# Patient Record
Sex: Female | Born: 1945
Health system: Southern US, Community
[De-identification: ages and names within clinical notes are randomized; demographics above are authoritative.]

## PROBLEM LIST (undated history)

## (undated) DIAGNOSIS — R55 Syncope and collapse: Secondary | ICD-10-CM

## (undated) DIAGNOSIS — N289 Disorder of kidney and ureter, unspecified: Secondary | ICD-10-CM

## (undated) DIAGNOSIS — Z8249 Family history of ischemic heart disease and other diseases of the circulatory system: Secondary | ICD-10-CM

## (undated) DIAGNOSIS — B009 Herpesviral infection, unspecified: Secondary | ICD-10-CM

## (undated) DIAGNOSIS — I10 Essential (primary) hypertension: Secondary | ICD-10-CM

## (undated) DIAGNOSIS — M1990 Unspecified osteoarthritis, unspecified site: Secondary | ICD-10-CM

## (undated) DIAGNOSIS — E559 Vitamin D deficiency, unspecified: Secondary | ICD-10-CM

## (undated) DIAGNOSIS — R7303 Prediabetes: Secondary | ICD-10-CM

## (undated) DIAGNOSIS — E785 Hyperlipidemia, unspecified: Secondary | ICD-10-CM

## (undated) HISTORY — DX: Unspecified osteoarthritis, unspecified site: M19.90

## (undated) HISTORY — DX: Essential (primary) hypertension: I10

## (undated) HISTORY — DX: Herpesviral infection, unspecified: B00.9

## (undated) HISTORY — PX: OTHER SURGICAL HISTORY: SHX169

## (undated) HISTORY — DX: Vitamin D deficiency, unspecified: E55.9

## (undated) HISTORY — DX: Family history of ischemic heart disease and other diseases of the circulatory system: Z82.49

## (undated) HISTORY — DX: Hyperlipidemia, unspecified: E78.5

## (undated) HISTORY — DX: Prediabetes: R73.03

## (undated) HISTORY — DX: Disorder of kidney and ureter, unspecified: N28.9

## (undated) HISTORY — DX: Syncope and collapse: R55

## (undated) HISTORY — PX: ABDOMINAL HYSTERECTOMY: SHX81

## (undated) HISTORY — PX: APPENDECTOMY: SHX54

## (undated) HISTORY — PX: BREAST SURGERY: SHX581

---

## 1992-02-02 HISTORY — PX: BREAST CYST ASPIRATION: SHX578

## 2004-03-21 ENCOUNTER — Encounter: Payer: Self-pay | Admitting: Family Medicine

## 2004-03-27 ENCOUNTER — Encounter: Payer: Self-pay | Admitting: Family Medicine

## 2005-09-06 ENCOUNTER — Encounter: Payer: Self-pay | Admitting: Family Medicine

## 2005-09-06 LAB — CONVERTED CEMR LAB
Cholesterol: 193 mg/dL
Glucose, Bld: 95 mg/dL
HDL: 60 mg/dL
LDL Cholesterol: 105 mg/dL
Pap Smear: NORMAL
Triglycerides: 144 mg/dL

## 2007-04-28 ENCOUNTER — Ambulatory Visit: Payer: Self-pay | Admitting: Family Medicine

## 2007-04-28 DIAGNOSIS — M546 Pain in thoracic spine: Secondary | ICD-10-CM | POA: Insufficient documentation

## 2007-04-28 DIAGNOSIS — E785 Hyperlipidemia, unspecified: Secondary | ICD-10-CM | POA: Insufficient documentation

## 2007-04-28 DIAGNOSIS — I10 Essential (primary) hypertension: Secondary | ICD-10-CM | POA: Insufficient documentation

## 2007-05-01 ENCOUNTER — Ambulatory Visit: Payer: Self-pay | Admitting: Family Medicine

## 2007-05-02 LAB — CONVERTED CEMR LAB
ALT: 18 units/L (ref 0–35)
AST: 18 units/L (ref 0–37)
Albumin: 4 g/dL (ref 3.5–5.2)
Alkaline Phosphatase: 54 units/L (ref 39–117)
BUN: 19 mg/dL (ref 6–23)
Bilirubin, Direct: 0.1 mg/dL (ref 0.0–0.3)
CO2: 30 meq/L (ref 19–32)
Calcium: 9.9 mg/dL (ref 8.4–10.5)
Chloride: 105 meq/L (ref 96–112)
Cholesterol: 256 mg/dL (ref 0–200)
Creatinine, Ser: 1 mg/dL (ref 0.4–1.2)
Direct LDL: 193 mg/dL
GFR calc Af Amer: 73 mL/min
GFR calc non Af Amer: 60 mL/min
Glucose, Bld: 111 mg/dL — ABNORMAL HIGH (ref 70–99)
HDL: 54.3 mg/dL (ref >39.0)
Potassium: 3.6 meq/L (ref 3.5–5.1)
Sodium: 141 meq/L (ref 135–145)
Total Bilirubin: 0.5 mg/dL (ref 0.3–1.2)
Total CHOL/HDL Ratio: 4.7
Total Protein: 7.3 g/dL (ref 6.0–8.3)
Triglycerides: 115 mg/dL (ref 0–149)
VLDL: 23 mg/dL (ref 0–40)

## 2007-06-14 ENCOUNTER — Ambulatory Visit: Payer: Self-pay | Admitting: Family Medicine

## 2007-06-14 ENCOUNTER — Telehealth: Payer: Self-pay | Admitting: Family Medicine

## 2007-06-14 DIAGNOSIS — R0789 Other chest pain: Secondary | ICD-10-CM | POA: Insufficient documentation

## 2007-06-14 DIAGNOSIS — M549 Dorsalgia, unspecified: Secondary | ICD-10-CM | POA: Insufficient documentation

## 2007-06-28 ENCOUNTER — Encounter: Payer: Self-pay | Admitting: Family Medicine

## 2007-08-24 ENCOUNTER — Telehealth: Payer: Self-pay | Admitting: Family Medicine

## 2007-08-24 DIAGNOSIS — M8580 Other specified disorders of bone density and structure, unspecified site: Secondary | ICD-10-CM | POA: Insufficient documentation

## 2007-08-30 ENCOUNTER — Encounter (INDEPENDENT_AMBULATORY_CARE_PROVIDER_SITE_OTHER): Payer: Self-pay | Admitting: *Deleted

## 2007-09-08 ENCOUNTER — Ambulatory Visit: Payer: Self-pay | Admitting: Family Medicine

## 2007-09-11 ENCOUNTER — Encounter: Payer: Self-pay | Admitting: Family Medicine

## 2007-09-11 ENCOUNTER — Telehealth: Payer: Self-pay | Admitting: Family Medicine

## 2007-09-11 LAB — CONVERTED CEMR LAB
ALT: 32 U/L
AST: 28 U/L
Albumin: 4.2 g/dL
Alkaline Phosphatase: 63 U/L
BUN: 20 mg/dL
Bilirubin, Direct: 0.1 mg/dL
CO2: 30 meq/L
Calcium: 9.9 mg/dL
Chloride: 105 meq/L
Cholesterol: 239 mg/dL
Creatinine, Ser: 1 mg/dL
Direct LDL: 157.5 mg/dL
GFR calc Af Amer: 73 mL/min
GFR calc non Af Amer: 60 mL/min
Glucose, Bld: 112 mg/dL — ABNORMAL HIGH
HDL: 62.2 mg/dL
Potassium: 4.6 meq/L
Sodium: 139 meq/L
Total Bilirubin: 0.8 mg/dL
Total CHOL/HDL Ratio: 3.8
Total Protein: 7.5 g/dL
Triglycerides: 121 mg/dL
VLDL: 24 mg/dL

## 2007-10-30 ENCOUNTER — Encounter (INDEPENDENT_AMBULATORY_CARE_PROVIDER_SITE_OTHER): Payer: Self-pay | Admitting: *Deleted

## 2007-12-04 ENCOUNTER — Telehealth: Payer: Self-pay | Admitting: Family Medicine

## 2007-12-12 ENCOUNTER — Ambulatory Visit: Payer: Self-pay | Admitting: Family Medicine

## 2007-12-15 LAB — CONVERTED CEMR LAB
ALT: 22 units/L (ref 0–35)
AST: 26 units/L (ref 0–37)
Albumin: 4.3 g/dL (ref 3.5–5.2)
Alkaline Phosphatase: 59 units/L (ref 39–117)
BUN: 18 mg/dL (ref 6–23)
Bilirubin, Direct: 0.1 mg/dL (ref 0.0–0.3)
CO2: 30 meq/L (ref 19–32)
Calcium: 10.5 mg/dL (ref 8.4–10.5)
Chloride: 104 meq/L (ref 96–112)
Cholesterol: 190 mg/dL (ref 0–200)
Creatinine, Ser: 0.9 mg/dL (ref 0.4–1.2)
GFR calc Af Amer: 82 mL/min
GFR calc non Af Amer: 68 mL/min
Glucose, Bld: 104 mg/dL — ABNORMAL HIGH (ref 70–99)
HDL: 60.1 mg/dL (ref >39.0)
LDL Cholesterol: 113 mg/dL — ABNORMAL HIGH (ref 0–99)
Potassium: 5.4 meq/L — ABNORMAL HIGH (ref 3.5–5.1)
Sodium: 141 meq/L (ref 135–145)
Total Bilirubin: 0.7 mg/dL (ref 0.3–1.2)
Total CHOL/HDL Ratio: 3.2
Total Protein: 7.7 g/dL (ref 6.0–8.3)
Triglycerides: 85 mg/dL (ref 0–149)
VLDL: 17 mg/dL (ref 0–40)

## 2007-12-25 ENCOUNTER — Ambulatory Visit: Payer: Self-pay | Admitting: Family Medicine

## 2007-12-25 DIAGNOSIS — J069 Acute upper respiratory infection, unspecified: Secondary | ICD-10-CM | POA: Insufficient documentation

## 2008-04-01 ENCOUNTER — Encounter: Payer: Self-pay | Admitting: Family Medicine

## 2008-04-03 ENCOUNTER — Encounter (INDEPENDENT_AMBULATORY_CARE_PROVIDER_SITE_OTHER): Payer: Self-pay | Admitting: *Deleted

## 2008-06-14 ENCOUNTER — Encounter (INDEPENDENT_AMBULATORY_CARE_PROVIDER_SITE_OTHER): Payer: Self-pay | Admitting: *Deleted

## 2008-11-13 ENCOUNTER — Ambulatory Visit: Payer: Self-pay | Admitting: Family Medicine

## 2008-11-13 ENCOUNTER — Telehealth: Payer: Self-pay | Admitting: Family Medicine

## 2008-11-13 DIAGNOSIS — E7800 Pure hypercholesterolemia, unspecified: Secondary | ICD-10-CM | POA: Insufficient documentation

## 2008-11-15 LAB — CONVERTED CEMR LAB
ALT: 18 units/L (ref 0–35)
AST: 20 units/L (ref 0–37)
Albumin: 3.9 g/dL (ref 3.5–5.2)
Alkaline Phosphatase: 51 units/L (ref 39–117)
BUN: 30 mg/dL — ABNORMAL HIGH (ref 6–23)
CO2: 27 meq/L (ref 19–32)
Calcium: 9.4 mg/dL (ref 8.4–10.5)
Chloride: 104 meq/L (ref 96–112)
Cholesterol: 205 mg/dL — ABNORMAL HIGH (ref 0–200)
Creatinine, Ser: 1 mg/dL (ref 0.4–1.2)
Direct LDL: 134.4 mg/dL
GFR calc non Af Amer: 59.77 mL/min (ref >60)
Glucose, Bld: 105 mg/dL — ABNORMAL HIGH (ref 70–99)
HDL: 54.7 mg/dL (ref >39.00)
Potassium: 4 meq/L (ref 3.5–5.1)
Sodium: 142 meq/L (ref 135–145)
Total Bilirubin: 0.8 mg/dL (ref 0.3–1.2)
Total CHOL/HDL Ratio: 4
Total Protein: 6.8 g/dL (ref 6.0–8.3)
Triglycerides: 117 mg/dL (ref 0.0–149.0)
VLDL: 23.4 mg/dL (ref 0.0–40.0)

## 2008-12-25 ENCOUNTER — Telehealth: Payer: Self-pay | Admitting: Family Medicine

## 2009-01-02 ENCOUNTER — Ambulatory Visit: Payer: Self-pay | Admitting: Family Medicine

## 2009-01-02 DIAGNOSIS — N952 Postmenopausal atrophic vaginitis: Secondary | ICD-10-CM | POA: Insufficient documentation

## 2009-01-02 LAB — CONVERTED CEMR LAB

## 2009-01-06 ENCOUNTER — Telehealth: Payer: Self-pay | Admitting: Family Medicine

## 2010-03-03 NOTE — Letter (Signed)
Summary: Results Follow up Letter  [place] at [facility]  [address] [place], [place] [address]   Phone: [phone]  Fax: [phone]    04/03/2008 MRN: [id number]    [facility] [address] [place], [place]  [address]    Dear Ms. Carrillo,  The following are the results of your recent test(s):  Test         Result    Pap Smear:        Normal _____  Not Normal _____ Comments: ______________________________________________________ Cholesterol: LDL(Bad cholesterol):         Your goal is less than:         HDL (Good cholesterol):       Your goal is more than: Comments:  ______________________________________________________ Mammogram:        Normal __X___  Not Normal _____ Comments:  Yearly follow up is recommended.   ___________________________________________________________________ Hemoccult:        Normal _____  Not normal _______ Comments:    _____________________________________________________________________ Other Tests:    We routinely do not discuss normal results over the telephone.  If you desire a copy of the results, or you have any questions about this information we can discuss them at your next office visit.   Sincerely,     Excell Seltzer, M.D.  AEB:lsf

## 2010-03-03 NOTE — Letter (Signed)
Summary: [place] No Show Letter  [place] at [facility]  [address] [place], [place] [address]   Phone: [phone]  Fax: [phone]    06/14/2008 MRN: [id number]  [facility] [address] [place], [place]  [address]   Dear Patricia Curtis,   Our records indicate that you missed your scheduled appointment with ____lab_________________ on _5.13.10__________.  Please contact this office to reschedule your appointment as soon as possible.  It is important that you keep your scheduled appointments with your physician, so we can provide you the best care possible.  Please be advised that there may be a charge for "no show" appointments.    Sincerely,   [place] at [facility]

## 2010-03-03 NOTE — Letter (Signed)
Summary: [facility] Records  [facility] Records   Imported By: Beau Fanny 08/24/2007 13:34:20  _____________________________________________________________________  External Attachment:    Type:   Image     Comment:   External Document

## 2010-03-03 NOTE — Progress Notes (Signed)
  Phone Note Call from Patient   Caller: Patient Summary of Call: Spoke to the patient. She had a Bone Density August 2009 at [facility] in [place]. She is going to call them today and have those results faxed to our office. Initial call taken by: Carlton Adam,  January 06, 2009 11:01 AM

## 2010-03-03 NOTE — Progress Notes (Signed)
  Phone Note Call from Patient   Caller: Patient Details for Reason: Pt wants to cancel the stress test Summary of Call: Pt has a $3000.00 deductible at this time. She cant afford to have the stress cardiolite at this time . Pt has set up her nerve conduction but cant do the stress right now.  Initial call taken by: Carlton Adam,  Jun 14, 2007 4:42 PM  Follow-up for Phone Call        okay, I doubt this is cardiac pain. Call if other symptoms. Follow-up by: Kerby Nora MD,  Jun 14, 2007 4:57 PM  Additional Follow-up for Phone Call Additional follow up Details #1::        Patient Advised.  Additional Follow-up by: Delilah Shan,  Jun 14, 2007 5:14 PM

## 2010-03-03 NOTE — Assessment & Plan Note (Signed)
Summary: NEW PT TO EST   Vital Signs:  Patient Profile:   65 Years Old Female Height:     64.5 inches Weight:      161.25 pounds Temp:     98.4 degrees F oral Pulse rate:   80 / minute Pulse rhythm:   regular BP sitting:   132 / 90  (left arm) Cuff size:   regular  Vitals Entered By: Delilah Shan (April 28, 2007 10:18 AM)                 Chief Complaint:  New Patient to Establish.  History of Present Illness: Last chol and DM screen 8/07 normal Cholesterol well controlled on simvastatin but out for 2 weeks.    HTN, well controlled when on B meds, out x 2 weeks.  Acute Visit History: Pertinent previous surgeries include appendectomy.          Current Allergies (reviewed today): No known allergies   Past Medical History:    Hyperlipidemia    Hypertension  Past Surgical History:    2006 stress test:negative    2006 Holter Monitor: negative    Appendectomy    Hysterectomy, partial vaginal 1979    Family History:    father: prostate cancer    mother: CHF, CAD, CABG  age 79    brother: CVA age 55, MI age 61    brother: age 80 CABG    brother: stents placed age 77    Family History of CAD Female 1st degree relative <50  Social History:    Occupation: Conservator, museum/gallery    Married    Never Smoked    Alcohol use-no    Regular exercise-no    Diet: low fat, weight watchers, fruits and veggies   Risk Factors:  Tobacco use:  never Alcohol use:  no Exercise:  no  Colonoscopy History:     Date of Last Colonoscopy:  12/13/2005    Results:  Normal   Mammogram History:     Date of Last Mammogram:  09/14/2005    Results:  Normal Bilateral   PAP Smear History:     Date of Last PAP Smear:  09/06/2005    Results:  Normal    Review of Systems  General      hot flashes  CV      Denies chest pain or discomfort.  Resp      Denies shortness of breath.  GI      Denies abdominal pain and bloody stools.  GU      Denies dysuria.   Physical  Exam  General:     Well-developed,well-nourished,in no acute distress; alert,appropriate and cooperative throughout examination Eyes:     No corneal or conjunctival inflammation noted. EOMI. Perrla. Funduscopic exam benign, without hemorrhages, exudates or papilledema. Vision grossly normal. Ears:     External ear exam shows no significant lesions or deformities.  Otoscopic examination reveals clear canals, tympanic membranes are intact bilaterally without bulging, retraction, inflammation or discharge. Hearing is grossly normal bilaterally. Nose:     External nasal examination shows no deformity or inflammation. Nasal mucosa are pink and moist without lesions or exudates. Mouth:     Oral mucosa and oropharynx without lesions or exudates.  Teeth in good repair. Neck:     no carotid bruit or thyromegaly  Lungs:     Normal respiratory effort, chest expands symmetrically. Lungs are clear to auscultation, no crackles or wheezes. Heart:     Normal rate and  regular rhythm. S1 and S2 normal without gallop, murmur, click, rub or other extra sounds.    Impression & Recommendations:  Problem # 1:  HYPERTENSION (ICD-401.9) poor control off medications. Refilled.  Follow at home. Her updated medication list for this problem includes:    Triamterene-hctz 37.5-25 Mg Caps (Triamterene-hctz) .Marland Kitchen... Take 1 tablet by mouth once a day   Problem # 2:  HYPERLIPIDEMIA (ICD-272.4) Due for chol check, she is off meds, but will likely need to restart. Check LFTs on statin. Her updated medication list for this problem includes:    Simvastatin 40 Mg Tabs (Simvastatin) .Marland Kitchen... Take 1 tablet by mouth once a day   Problem # 3:  Preventive Health Care (ICD-V70.0) Due for GYN exam, breast exam, mammogram.  UTD with vaccines.  Encouraged exercise, weight loss, healthy eating habits.   Complete Medication List: 1)  Simvastatin 40 Mg Tabs (Simvastatin) .... Take 1 tablet by mouth once a day 2)  Triamterene-hctz  37.5-25 Mg Caps (Triamterene-hctz) .... Take 1 tablet by mouth once a day 3)  Etodolac 500 Mg Tabs (Etodolac) .... Take 1 tablet by mouth two times a day 4)  Cymbalta 60 Mg Cpep (Duloxetine hcl) .... Take 1 tablet by mouth once a day 5)  Vitamin C 500 Mg Tabs (Ascorbic acid) .... Take 1 tablet by mouth once a day 6)  Vitamin D 400 Unit Tabs (Cholecalciferol) .... Take 1 tablet by mouth once a day 7)  Multivitamins Tabs (Multiple vitamin) .... Take 1 tablet by mouth once a day 8)  Aspirin 81 Mg Tabs (Aspirin) .... Take 1 tablet by mouth once a day 9)  Calcium Carbonate-vitamin D 600-400 Mg-unit Tabs (Calcium carbonate-vitamin d) .... Take 1 tablet by mouth once a day   Patient Instructions: 1)  Return fo fasting lipids and CMET Dx 272.0 2)  black cohosh  for hot flashes    Prescriptions: CYMBALTA 60 MG  CPEP (DULOXETINE HCL) Take 1 tablet by mouth once a day  #30 x 6   Entered and Authorized by:   Kerby Nora MD   Signed by:   Kerby Nora MD on 04/28/2007   Method used:   Electronically sent to ...       [facility] [phone]*       [address] [place], [place]  [address]       Ph: [phone] or [phone]       Fax: [phone]   RxID:   [id number] TRIAMTERENE-HCTZ 37.5-25 MG  CAPS (TRIAMTERENE-HCTZ) Take 1 tablet by mouth once a day  #30 x 6   Entered and Authorized by:   Kerby Nora MD   Signed by:   Kerby Nora MD on 04/28/2007   Method used:   Electronically sent to ...       [facility] [phone]*       [address] [place], [place]  [address]       Ph: [phone] or [phone]       Fax: [phone]   RxID:   [id number] SIMVASTATIN 40 MG  TABS (SIMVASTATIN) Take 1 tablet by mouth once a day  #30 x 6   Entered and Authorized by:   Kerby Nora MD   Signed by:   Kerby Nora MD on 04/28/2007   Method used:  Electronically sent to ...       [facility] [phone]*       [address]        [place], [place]  [address]       Ph: [phone] or [phone]       Fax: [phone]   RxID:   [id number]  ] Current Allergies (reviewed today): No known allergies  Current Medications (including changes made in today's visit):  SIMVASTATIN 40 MG  TABS (SIMVASTATIN) Take 1 tablet by mouth once a day TRIAMTERENE-HCTZ 37.5-25 MG  CAPS (TRIAMTERENE-HCTZ) Take 1 tablet by mouth once a day ETODOLAC 500 MG  TABS (ETODOLAC) Take 1 tablet by mouth two times a day CYMBALTA 60 MG  CPEP (DULOXETINE HCL) Take 1 tablet by mouth once a day VITAMIN C 500 MG  TABS (ASCORBIC ACID) Take 1 tablet by mouth once a day VITAMIN D 400 UNIT  TABS (CHOLECALCIFEROL) Take 1 tablet by mouth once a day MULTIVITAMINS   TABS (MULTIPLE VITAMIN) Take 1 tablet by mouth once a day ASPIRIN 81 MG  TABS (ASPIRIN) Take 1 tablet by mouth once a day CALCIUM CARBONATE-VITAMIN D 600-400 MG-UNIT  TABS (CALCIUM CARBONATE-VITAMIN D) Take 1 tablet by mouth once a day

## 2010-03-03 NOTE — Letter (Signed)
Summary: Results Follow up Letter  [place] at [facility]  [address] [place], [place] [address]   Phone: [phone]  Fax: [phone]    10/30/2007 MRN: [id number]  [facility] [address] [place], [place]  [address]  Dear Ms. Goetzinger,  The following are the results of your recent test(s):  Test         Result    Pap Smear:        Normal _____  Not Normal _____ Comments: ______________________________________________________ Cholesterol: LDL(Bad cholesterol):         Your goal is less than:         HDL (Good cholesterol):       Your goal is more than: Comments:  ______________________________________________________ Mammogram:        Normal _____  Not Normal _____ Comments:  ___________________________________________________________________ Hemoccult:        Normal _____  Not normal _______ Comments:    _____________________________________________________________________ Other Tests:   Bone density:  Stable bone density test.  Please continue your Calcium with Vitamin D and exercise.  Repeat test in 2 years.  (2011).    We routinely do not discuss normal results over the telephone.  If you desire a copy of the results, or you have any questions about this information we can discuss them at your next office visit.   Sincerely,    Excell Seltzer, M.D.  AEB:lsf

## 2010-03-03 NOTE — Progress Notes (Signed)
Summary: refill request for cymbalta  Phone Note Refill Request Message from:  Pharmacy  Refills Requested: Medication #1:  CYMBALTA 30 MG CPEP Take 1 tablet by mouth once a day [facility] [place], phone [phone].  Initial call taken by: Lowella Petties CMA,  December 25, 2008 1:19 PM  Follow-up for Phone Call        Schedule f/u with Dr. Ermalene Searing  Has not been seen in 1 year  Will refill this time only  ok to call in Follow-up by: Hannah Beat MD,  December 25, 2008 2:14 PM  Additional Follow-up for Phone Call Additional follow up Details #1::        rx called in Additional Follow-up by: Benny Lennert CMA (AAMA),  December 25, 2008 3:08 PM    Prescriptions: CYMBALTA 30 MG CPEP (DULOXETINE HCL) Take 1 tablet by mouth once a day  #30 x 0   Entered and Authorized by:   Hannah Beat MD   Signed by:   Hannah Beat MD on 12/25/2008   Method used:   Telephoned to ...         RxID:   [id number]

## 2010-03-03 NOTE — Progress Notes (Signed)
Summary: Old Records: [facility]  Phone Note Other Incoming   Details for Reason: Old Records: [facility] Summary of Call: Notify pt old records reviewed. She had osteopenia on her last bone density test in 2007. She is on calcium. We check DXA every 2 years, so she is due. Let me know if we can send referral.  Initial call taken by: Kerby Nora MD,  August 24, 2007 12:09 PM  Follow-up for Phone Call        Left message on voicemail to return call. Delilah Shan  August 24, 2007 12:22 PM   Left message on voicemail to return call. Delilah Shan  August 28, 2007 10:47 AM   Patient Advised. Please send order to [facility].  Patient says she had her last one in [facility] and is willing to go back there if need be or [facility] is fine with her.  She does need an early a.m. appt. wherever. Follow-up by: Delilah Shan,  August 28, 2007 2:28 PM  New Problems: OSTEOPENIA (ICD-733.90)   New Problems: OSTEOPENIA (ICD-733.90)       Preventive Care Screening  Bone Density:    Date:  09/01/2005    Next Due:  09/2007    Results:  abnormal, osteopenia     osteopenia

## 2010-03-03 NOTE — Progress Notes (Signed)
Summary: needs cymbalta refilled  Phone Note Refill Request Call back at [phone] Message from:  Patient  Refills Requested: Medication #1:  CYMBALTA 30 MG CPEP Take 1 tablet by mouth once a day gradually wean down Please send to [facility] pharmacy, phone [phone].  Initial call taken by: Lowella Petties CMA,  November 13, 2008 10:11 AM  Follow-up for Phone Call        call in to pharm Follow-up by: Hannah Beat MD,  November 13, 2008 10:31 AM  Additional Follow-up for Phone Call Additional follow up Details #1::        rx called in Additional Follow-up by: Benny Lennert CMA (AAMA),  November 13, 2008 10:34 AM    New/Updated Medications: CYMBALTA 30 MG CPEP (DULOXETINE HCL) Take 1 tablet by mouth once a day Prescriptions: CYMBALTA 30 MG CPEP (DULOXETINE HCL) Take 1 tablet by mouth once a day  #30 x 5   Entered and Authorized by:   Hannah Beat MD   Signed by:   Benny Lennert CMA (AAMA) on 11/13/2008   Method used:   Telephoned to ...         RxID:   [id number]

## 2010-03-03 NOTE — Progress Notes (Signed)
Summary: lost script for cymbalta  Phone Note Call from Patient Message from:  Patient  Caller: Patient Call For: Kerby Nora MD Summary of Call: Pt states she was given script for cymbalta 30 mgs at last office visit, she has lost this, asks if [facility] [address]- she has some 60 mg's but doesnt want to go back to that, she is trying to come off of this. Initial call taken by: Lowella Petties,  December 04, 2007 9:58 AM    New/Updated Medications: CYMBALTA 30 MG CPEP (DULOXETINE HCL) Take 1 tablet by mouth once a day gradually wean down   Prescriptions: CYMBALTA 30 MG CPEP (DULOXETINE HCL) Take 1 tablet by mouth once a day gradually wean down  #30 x 3   Entered and Authorized by:   Kerby Nora MD   Signed by:   Kerby Nora MD on 12/04/2007   Method used:   [facility]        [facility] [phone]* (retail)       [address] [place], [place]  [address]       Ph: [phone] or [phone]       Fax: [phone]   RxID:   [id number]

## 2010-03-03 NOTE — Letter (Signed)
Summary: [place] No Show Letter  [place] at [facility]  [address] [place], [place] [address]   Phone: [phone]  Fax: [phone]    08/30/2007 MRN: [id number]  [facility] [place] [facility] [address] [place], [place]  [address]   Dear Ms. Voshell,   Our records indicate that you missed your scheduled appointment with __lab_______________ on _7-29-09__________.  Please contact this office to reschedule your appointment as soon as possible.  It is important that you keep your scheduled appointments with your physician, so we can provide you the best care possible.  Please be advised that there may be a charge for "no show" appointments.    Sincerely,   [place] at [facility]

## 2010-03-03 NOTE — Assessment & Plan Note (Signed)
Summary: cpx/rbh   Vital Signs:  Patient profile:   65 year old female Height:      64.5 inches Weight:      165.6 pounds BMI:     28.09 Temp:     98.1 degrees F oral Pulse rate:   76 / minute Pulse rhythm:   regular BP sitting:   120 / 78  (left arm) Cuff size:   regular  Vitals Entered By: Benny Lennert CMA Duncan Dull) (January 02, 2009 2:05 PM)  History of Present Illness: Chief complaint cpx with pap  The patient is here for annual wellness exam and preventative care.    She is moving to [place] with her husband for his new job.   She had partial hysterectomy years ago..? if cervix remains.   Cervical DDD. controlled with Cymbalta.   Has noted bruise on low back oand right inner.  Has been taking baby aspirin and tylenol.   Problems Prior to Update: 1)  Routine Gynecological Examination  (ICD-V72.31) 2)  Physical Examination  (ICD-V70.0) 3)  Vaginitis, Atrophic  (ICD-627.3) 4)  Pure Hypercholesterolemia  (ICD-272.0) 5)  Uri  (ICD-465.9) 6)  Osteopenia  (ICD-733.90) 7)  Back Pain, Upper  (ICD-724.5) 8)  Chest Pain, Atypical  (ICD-786.59) 9)  Family History of Cad Female 1st Degree Relative <50  (ICD-V17.3) 10)  Back Pain, Thoracic Region, Chronic  (ICD-724.1) 11)  Hypertension  (ICD-401.9) 12)  Hyperlipidemia  (ICD-272.4)  Current Medications (verified): 1)  Simvastatin 40 Mg  Tabs (Simvastatin) .... Take 1 Tablet By Mouth Once A Day 2)  Triamterene-Hctz 37.5-25 Mg  Caps (Triamterene-Hctz) .... Take 1 Tablet By Mouth Once A Day 3)  Cymbalta 30 Mg Cpep (Duloxetine Hcl) .... Take 1 Tablet By Mouth Once A Day 4)  Vitamin C 500 Mg  Tabs (Ascorbic Acid) .... Take 1 Tablet By Mouth Once A Day 5)  Vitamin D 400 Unit  Tabs (Cholecalciferol) .... Take 1 Tablet By Mouth Once A Day 6)  Multivitamins   Tabs (Multiple Vitamin) .... Take 1 Tablet By Mouth Once A Day 7)  Aspirin 81 Mg  Tabs (Aspirin) .... Take 1 Tablet By Mouth Once A Day 8)  Calcium Carbonate-Vitamin D 600-400  Mg-Unit  Tabs (Calcium Carbonate-Vitamin D) .... Take 1 Tablet By Mouth Once A Day  Allergies (verified): No Known Drug Allergies  Past History:  Past medical, surgical, family and social histories (including risk factors) reviewed, and no changes noted (except as noted below).  Past Medical History: Reviewed history from 04/28/2007 and no changes required. Hyperlipidemia Hypertension  Past Surgical History: Reviewed history from 04/28/2007 and no changes required. 2006 stress test:negative 2006 Holter Monitor: negative Appendectomy Hysterectomy, partial vaginal 1979  Family History: Reviewed history from 04/28/2007 and no changes required. father: prostate cancer mother: CHF, CAD, CABG  age 64 brother: CVA age 76, MI age 37 brother: age 71 CABG brother: stents placed age 45 Family History of CAD Female 1st degree relative <50  Social History: Reviewed history from 04/28/2007 and no changes required. Occupation: Conservator, museum/gallery Married Never Smoked Alcohol use-no Regular exercise-no Diet: low fat, weight watchers, fruits and veggies  Review of Systems General:  Denies fatigue and fever. CV:  Denies chest pain or discomfort. Resp:  Denies shortness of breath. GI:  Complains of constipation; denies abdominal pain, bloody stools, and diarrhea. GU:  Denies abnormal vaginal bleeding, dysuria, hematuria, nocturia, urinary frequency, and urinary hesitancy; vaginal dryness, vaginal itching no odor. .  Physical Exam  General:  Well-developed,well-nourished,in no acute distress; alert,appropriate and cooperative throughout examination Eyes:  No corneal or conjunctival inflammation noted. EOMI. Perrla. Funduscopic exam benign, without hemorrhages, exudates or papilledema. Vision grossly normal. Ears:  External ear exam shows no significant lesions or deformities.  Otoscopic examination reveals clear canals, tympanic membranes are intact bilaterally without bulging, retraction,  inflammation or discharge. Hearing is grossly normal bilaterally. Nose:  External nasal examination shows no deformity or inflammation. Nasal mucosa are pink and moist without lesions or exudates. Mouth:  Oral mucosa and oropharynx without lesions or exudates.  Teeth in good repair. Neck:  no carotid bruit or thyromegaly no cervical or supraclavicular lymphadenopathy  Lungs:  Normal respiratory effort, chest expands symmetrically. Lungs are clear to auscultation, no crackles or wheezes. Heart:  Normal rate and regular rhythm. S1 and S2 normal without gallop, murmur, click, rub or other extra sounds. Abdomen:  Bowel sounds positive,abdomen soft and non-tender without masses, organomegaly or hernias noted. Genitalia:  Pelvic Exam:        External: vagainal atrophy and episiotomy scarring        Vagina: atrophy and abraisions        Cervix: none        Adnexa: normal bimanual exam without masses or fullness        Uterus: none        Pap smear: not performed Skin:  linear bruise right inner thigh.Patricia Kitchenappears from object, bruising mild low back Psych:  Cognition and judgment appear intact. Alert and cooperative with normal attention span and concentration. No apparent delusions, illusions, hallucinations   Impression & Recommendations:  Problem # 1:  Preventive Health Care (ICD-V70.0) Reviewed preventive care protocols, scheduled due services, and updated immunizations. Encouraged exercise, weight loss, healthy eating habits.   Problem # 2:  Gynecological examination-routine (ICD-V72.31) No cervix.Patricia KitchenDVE only...nml ovary exam.   Problem # 3:  VAGINITIS, ATROPHIC (ICD-627.3) Discussed risks and benefits of premarin vaginal cream.  Treat with taper down to smallest effective dose. Follow up with GYN if symptoms not resolving.  Her updated medication list for this problem includes:    Premarin 0.625 Mg/gm Crea (Estrogens, conjugated) .Patricia Curtis... 1 g pv daily x 1-2 weeks then space gradually to 1-3 times a  week  Complete Medication List: 1)  Simvastatin 40 Mg Tabs (Simvastatin) .... Take 1 tablet by mouth once a day 2)  Triamterene-hctz 37.5-25 Mg Caps (Triamterene-hctz) .... Take 1 tablet by mouth once a day 3)  Cymbalta 30 Mg Cpep (Duloxetine hcl) .... Take 1 tablet by mouth once a day 4)  Vitamin C 500 Mg Tabs (Ascorbic acid) .... Take 1 tablet by mouth once a day 5)  Vitamin D 400 Unit Tabs (Cholecalciferol) .... Take 1 tablet by mouth once a day 6)  Multivitamins Tabs (Multiple vitamin) .... Take 1 tablet by mouth once a day 7)  Aspirin 81 Mg Tabs (Aspirin) .... Take 1 tablet by mouth once a day 8)  Calcium Carbonate-vitamin D 600-400 Mg-unit Tabs (Calcium carbonate-vitamin d) .... Take 1 tablet by mouth once a day 9)  Premarin 0.625 Mg/gm Crea (Estrogens, conjugated) .Patricia Curtis.. 1 g pv daily x 1-2 weeks then space gradually to 1-3 times a week  Other Orders: Radiology Referral (Radiology) Tdap => 40yrs IM (25956) Admin 1st Vaccine (38756) Admin 1st Vaccine [facility]) [phone])  Patient Instructions: 1)  Referral Appointment Information 2)  Day/Date: 3)  Time: 4)  Place/MD: 5)  Address: 6)  Phone/Fax: 7)  Patient given appointment information. Information/Orders faxed/mailed.  8)  Start premarin cream ..follow up if not improving.  Prescriptions: PREMARIN 0.625 MG/GM CREA (ESTROGENS, CONJUGATED) 1 g PV daily x 1-2 weeks then space gradually to 1-3 times a week  #1 x 3   Entered and Authorized by:   Kerby Nora MD   Signed by:   Kerby Nora MD on 01/02/2009   Method used:   Electronically to        [facility] [phone]* (retail)       [address] [place], [place]  [address]       Ph: [phone] or [phone]       Fax: [phone]   RxID:   [id number]   Current Allergies (reviewed today): No known allergies   Flu Vaccine Result Date:  12/02/2008 Flu Vaccine Result:  given Flu Vaccine Next Due:  1 yr Flex Sig Next Due:  Not Indicated Hemoccult  Next Due:  Not Indicated Last PAP:  Normal (09/06/2005 10:59:37 AM) PAP Result Date:  01/02/2009 PAP Result:  DVE PAP Next Due:  1 yr    Past Medical History:    Reviewed history from 04/28/2007 and no changes required:       Hyperlipidemia       Hypertension  Past Surgical History:    Reviewed history from 04/28/2007 and no changes required:       2006 stress test:negative       2006 Holter Monitor: negative       Appendectomy       Hysterectomy, partial vaginal 1979         Tetanus/Td Vaccine    Vaccine Type: Tdap    Site: left deltoid    Mfr: GlaxoSmithKline    Dose: 0.5 ml    Given by: Benny Lennert CMA (AAMA)    Exp. Date: 08/02/2010    Lot #: [id number]

## 2015-06-27 LAB — HM COLONOSCOPY

## 2017-02-07 ENCOUNTER — Ambulatory Visit: Payer: BLUE CROSS/BLUE SHIELD | Attending: Internal Medicine | Admitting: Physical Therapy

## 2017-02-07 ENCOUNTER — Encounter: Payer: Self-pay | Admitting: Physical Therapy

## 2017-02-07 ENCOUNTER — Other Ambulatory Visit: Payer: Self-pay

## 2017-02-07 DIAGNOSIS — M546 Pain in thoracic spine: Secondary | ICD-10-CM | POA: Insufficient documentation

## 2017-02-07 DIAGNOSIS — M6281 Muscle weakness (generalized): Secondary | ICD-10-CM | POA: Diagnosis present

## 2017-02-07 DIAGNOSIS — R293 Abnormal posture: Secondary | ICD-10-CM | POA: Diagnosis present
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
## 2017-02-10 ENCOUNTER — Other Ambulatory Visit: Payer: Self-pay

## 2017-02-10 ENCOUNTER — Encounter: Payer: Self-pay | Admitting: Physical Therapy

## 2017-02-10 ENCOUNTER — Ambulatory Visit: Payer: BLUE CROSS/BLUE SHIELD | Admitting: Physical Therapy

## 2017-02-10 DIAGNOSIS — M546 Pain in thoracic spine: Secondary | ICD-10-CM

## 2017-02-10 DIAGNOSIS — R293 Abnormal posture: Secondary | ICD-10-CM

## 2017-02-10 DIAGNOSIS — M6281 Muscle weakness (generalized): Secondary | ICD-10-CM
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
## 2017-02-15 ENCOUNTER — Encounter: Payer: Self-pay | Admitting: Physical Therapy

## 2017-02-15 ENCOUNTER — Ambulatory Visit: Payer: BLUE CROSS/BLUE SHIELD | Admitting: Physical Therapy

## 2017-02-15 DIAGNOSIS — M546 Pain in thoracic spine: Secondary | ICD-10-CM | POA: Diagnosis not present

## 2017-02-15 DIAGNOSIS — R293 Abnormal posture: Secondary | ICD-10-CM

## 2017-02-15 DIAGNOSIS — M6281 Muscle weakness (generalized): Secondary | ICD-10-CM
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
## 2017-02-17 ENCOUNTER — Ambulatory Visit: Payer: BLUE CROSS/BLUE SHIELD

## 2017-02-22 ENCOUNTER — Ambulatory Visit: Payer: BLUE CROSS/BLUE SHIELD

## 2017-02-23 ENCOUNTER — Encounter: Payer: Self-pay | Admitting: Physical Therapy

## 2017-02-23 ENCOUNTER — Other Ambulatory Visit: Payer: Self-pay

## 2017-02-23 ENCOUNTER — Ambulatory Visit: Payer: BLUE CROSS/BLUE SHIELD | Admitting: Physical Therapy

## 2017-02-23 DIAGNOSIS — M546 Pain in thoracic spine: Secondary | ICD-10-CM | POA: Diagnosis not present

## 2017-02-23 DIAGNOSIS — M6281 Muscle weakness (generalized): Secondary | ICD-10-CM

## 2017-02-23 DIAGNOSIS — R293 Abnormal posture: Secondary | ICD-10-CM
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
## 2017-03-01 ENCOUNTER — Ambulatory Visit: Payer: BLUE CROSS/BLUE SHIELD

## 2017-03-02 ENCOUNTER — Ambulatory Visit: Payer: BLUE CROSS/BLUE SHIELD

## 2017-10-17 NOTE — Progress Notes (Deleted)
Cardiology Office Note  Date:  10/17/2017   ID:  Patricia Curtis, DOB [birth year], MRN [id number]  PCP:  No primary care provider on file.   No chief complaint on file.   HPI:   Back pain Atypical chest pain HTN Hyperlipidemia    2006 stress test:negative 2006 Holter Monitor: negative Appendectomy Hysterectomy, partial vaginal 1979  father: prostate cancer mother: CHF, CAD, CABG  age 70 brother: CVA age 5, MI age 94 brother: age 40 CABG brother: stents placed age 42 Family History of CAD Female 1st degree relative <50   PMH:   has no past medical history on file.  PSH:   No past surgical history on file.  No current outpatient medications on file.   No current facility-administered medications for this visit.      Allergies:   Patient has no allergy information on record.   Social History:  The patient     Family History:   family history is not on file.    Review of Systems: ROS   PHYSICAL EXAM: VS:  There were no vitals taken for this visit. , BMI There is no height or weight on file to calculate BMI. GEN: Well nourished, well developed, in no acute distress HEENT: normal Neck: no JVD, carotid bruits, or masses Cardiac: RRR; no murmurs, rubs, or gallops,no edema  Respiratory:  clear to auscultation bilaterally, normal work of breathing GI: soft, nontender, nondistended, + BS MS: no deformity or atrophy Skin: warm and dry, no rash Neuro:  Strength and sensation are intact Psych: euthymic mood, full affect    Recent Labs: No results found for requested labs within last 8760 hours.    Lipid Panel Lab Results  Component Value Date   CHOL 205 (H) 11/13/2008   HDL 54.70 11/13/2008   LDLCALC 113 (H) 12/12/2007   TRIG 117.0 11/13/2008      Wt Readings from Last 3 Encounters:  No data found for Wt       ASSESSMENT AND PLAN:  No diagnosis found.   Disposition:   F/U  6 months  No orders of the defined types were placed in this  encounter.    Signed, Esmond Plants, M.D., Ph.D. 10/17/2017  [place], [place]

## 2017-10-20 ENCOUNTER — Ambulatory Visit: Payer: BLUE CROSS/BLUE SHIELD | Admitting: Cardiovascular Disease

## 2017-11-03 ENCOUNTER — Encounter: Payer: Self-pay | Admitting: Internal Medicine

## 2017-11-03 ENCOUNTER — Ambulatory Visit (INDEPENDENT_AMBULATORY_CARE_PROVIDER_SITE_OTHER): Payer: Medicare Other | Admitting: Internal Medicine

## 2017-11-03 VITALS — BP 132/76 | HR 69 | Temp 98.5°F | Ht 64.5 in | Wt 149.0 lb

## 2017-11-03 DIAGNOSIS — M549 Dorsalgia, unspecified: Secondary | ICD-10-CM | POA: Diagnosis not present

## 2017-11-03 DIAGNOSIS — E119 Type 2 diabetes mellitus without complications: Secondary | ICD-10-CM | POA: Insufficient documentation

## 2017-11-03 DIAGNOSIS — N183 Chronic kidney disease, stage 3 unspecified: Secondary | ICD-10-CM

## 2017-11-03 DIAGNOSIS — M546 Pain in thoracic spine: Secondary | ICD-10-CM

## 2017-11-03 DIAGNOSIS — Z13818 Encounter for screening for other digestive system disorders: Secondary | ICD-10-CM

## 2017-11-03 DIAGNOSIS — R7303 Prediabetes: Secondary | ICD-10-CM

## 2017-11-03 DIAGNOSIS — Z1329 Encounter for screening for other suspected endocrine disorder: Secondary | ICD-10-CM | POA: Diagnosis not present

## 2017-11-03 DIAGNOSIS — M542 Cervicalgia: Secondary | ICD-10-CM | POA: Diagnosis not present

## 2017-11-03 DIAGNOSIS — Z23 Encounter for immunization: Secondary | ICD-10-CM | POA: Diagnosis not present

## 2017-11-03 DIAGNOSIS — Z1159 Encounter for screening for other viral diseases: Secondary | ICD-10-CM

## 2017-11-03 DIAGNOSIS — Z1283 Encounter for screening for malignant neoplasm of skin: Secondary | ICD-10-CM | POA: Diagnosis not present

## 2017-11-03 DIAGNOSIS — Z8249 Family history of ischemic heart disease and other diseases of the circulatory system: Secondary | ICD-10-CM

## 2017-11-03 DIAGNOSIS — Z0184 Encounter for antibody response examination: Secondary | ICD-10-CM

## 2017-11-03 DIAGNOSIS — E559 Vitamin D deficiency, unspecified: Secondary | ICD-10-CM | POA: Insufficient documentation

## 2017-11-03 DIAGNOSIS — Z1231 Encounter for screening mammogram for malignant neoplasm of breast: Secondary | ICD-10-CM | POA: Diagnosis not present

## 2017-11-03 DIAGNOSIS — E785 Hyperlipidemia, unspecified: Secondary | ICD-10-CM | POA: Diagnosis not present

## 2017-11-03 DIAGNOSIS — G8929 Other chronic pain: Secondary | ICD-10-CM | POA: Insufficient documentation

## 2017-11-03 DIAGNOSIS — M8580 Other specified disorders of bone density and structure, unspecified site: Secondary | ICD-10-CM | POA: Diagnosis not present

## 2017-11-03 DIAGNOSIS — I10 Essential (primary) hypertension: Secondary | ICD-10-CM

## 2017-11-03 MED ORDER — TIZANIDINE HCL 4 MG PO CAPS: 4.0000 mg | ORAL_CAPSULE | Freq: Every evening | ORAL | 2 refills | Status: DC | PRN

## 2017-11-03 NOTE — Progress Notes (Addendum)
Chief Complaint  Patient presents with  . Establish Care   New patient  1. Chronic neck and mid back pain since 2005 had facet injections x 2 was seeing pain clinic on tramadol 50 mg 1-2 x per day prn, cymbalta 30 mg which helps she reports tense muscles in her back has old Rx baclofen has passed out 2/2 pain in neck and back prev.  2. Prediabetes diet controlled  3. Strong FH cardiac disease no chest pain upcoming appt with cardiology Dr. Rockey Situ 4. HTN/HLD. BP controlled on lasix 10 and lis 10 mg qd and on crestor 40 mg qd  5. H/o vitamin D def on D3 5000 IU QD  6. Left upper back lesion not changing there and husband and pt want removed    Review of Systems  Constitutional: Negative for weight loss.  HENT: Negative for hearing loss.   Eyes: Negative for blurred vision.  Respiratory: Negative for shortness of breath.   Cardiovascular: Negative for chest pain.  Gastrointestinal: Negative for abdominal pain.  Musculoskeletal: Positive for back pain and neck pain.  Skin: Negative for rash.       +skin lesion   Neurological: Negative for headaches.  Psychiatric/Behavioral: Negative for depression.   No past medical history on file. No past surgical history on file. No family history on file. Social History   Socioeconomic History  . Marital status: Married    Spouse name: Not on file  . Number of children: Not on file  . Years of education: Not on file  . Highest education level: Not on file  Occupational History  . Not on file  Social Needs  . Financial resource strain: Not on file  . Food insecurity:    Worry: Not on file    Inability: Not on file  . Transportation needs:    Medical: Not on file    Non-medical: Not on file  Tobacco Use  . Smoking status: Not on file  Substance and Sexual Activity  . Alcohol use: Not on file  . Drug use: Not on file  . Sexual activity: Not on file  Lifestyle  . Physical activity:    Days per week: Not on file    Minutes per  session: Not on file  . Stress: Not on file  Relationships  . Social connections:    Talks on phone: Not on file    Gets together: Not on file    Attends religious service: Not on file    Active member of club or organization: Not on file    Attends meetings of clubs or organizations: Not on file    Relationship status: Not on file  . Intimate partner violence:    Fear of current or ex partner: Not on file    Emotionally abused: Not on file    Physically abused: Not on file    Forced sexual activity: Not on file  Other Topics Concern  . Not on file  Social History Narrative  . Not on file   Current Meds  Medication Sig  . aspirin EC 81 MG tablet Take by mouth daily.   . cycloSPORINE (RESTASIS) 0.05 % ophthalmic emulsion 1 drop 2 (two) times daily.  . DULoxetine (CYMBALTA) 30 MG capsule Take 30 mg by mouth daily.   . furosemide (LASIX) 20 MG tablet TK 1/2 T PO ONCE D  . lisinopril (PRINIVIL,ZESTRIL) 10 MG tablet Take by mouth daily.   . Melatonin 3 MG TABS Take by mouth daily.   Marland Kitchen  rosuvastatin (CRESTOR) 40 MG tablet Take 40 mg by mouth daily.   . traMADol (ULTRAM) 50 MG tablet tramadol 50 mg tablet  Take 1 tablet every 12 hours by oral route as needed.   Not on File No results found for this or any previous visit (from the past 2160 hour(s)). Objective  Body mass index is 25.18 kg/m. Wt Readings from Last 3 Encounters:  11/03/17 149 lb (67.6 kg)   Temp Readings from Last 3 Encounters:  11/03/17 98.5 F (36.9 C) (Oral)   BP Readings from Last 3 Encounters:  11/03/17 132/76   Pulse Readings from Last 3 Encounters:  11/03/17 69    Physical Exam  Constitutional: She is oriented to person, place, and time. Vital signs are normal. She appears well-developed and well-nourished. She is cooperative.  HENT:  Head: Normocephalic and atraumatic.  Mouth/Throat: Oropharynx is clear and moist and mucous membranes are normal.  Eyes: Pupils are equal, round, and reactive to  light. Conjunctivae are normal.  Cardiovascular: Normal rate, regular rhythm and normal heart sounds.  Pulmonary/Chest: Effort normal and breath sounds normal.  Neurological: She is alert and oriented to person, place, and time. Gait normal.  Skin: Skin is warm and dry.     Psychiatric: She has a normal mood and affect. Her speech is normal and behavior is normal. Judgment and thought content normal. Cognition and memory are normal.  Nursing note and vitals reviewed.   Assessment   1. Chronic neck and mid back pain since 2005 had facet injections x 2 2. Prediabetes  3. Strong FH cardiac disease 4. HTN/HLD 5. H/o vitamin D def  6. HM  Plan   1. Trial of zanaflex  Signed pain contract tramadol 50 mg bid prn  Cont cymbalta 30 mg qd helps Get records pain clinic in [place] I.e imaging signed today  2. Check labs  3. Pending appt Dr. Rockey Situ Control risk factors  4. Cont aspirin 81 mg qd Lisinopril 10 mg qd  Lasix 10 mg qd  crestor 40 mg qd  5. On D3 5000 IU qd  6.  Given flu shot today  Consider Tdap in future had td only shingrix had 01/07/17 and 06/23/17  Check MMR status and hep C  Check fasting labs upcoming   Get copy of other vaccines (? Which pna vaccine had), mammo 06/2016, pap (referred mammogram today due), DEXA Dr. Hardin Negus former PCP  colonoscopy 06/27/15 [place] [facility] -[facility] small polyp sessile tubular f/u in 3 years to 5 years  Never smoker  S/p hysterectomy DUB ovaries intact no h/o abnormal pap  Requested records Dr. Modesto Charon renal in [place], [place] Requested records pain clinic I.e images in [place] neck and mid back  Reviewed records Dr. Shanna Cisco  H/o right shoulder tendonitis, subacromial impingement, lateral epicondylitis, mid back and T back pain due to arthritis  -was on baclofen 10 mg prn not currently using  -h/o multiple facet injections  MRI 10/10/14 C spine Diffuse DDD C spine moderate disc space narrowing C5/6, C6/7 (moderate) MRI T spine  07/01/16 mild to moderate b/l facet hypertrophy mild b/l foraminal stenosis T10/11 mild right foraminal stenosis, mild disc bulges and ligamentum flavum hypertrophy with mild spinal canal stenosis C5/7 and very small b/l right and left pleural effusions.   DM2 history   [facility] records [facility] CKD 04/20/16 stage 2/3 and bland urine likely 2/2 HTN, rec reduce D3 to 5000 IU qd, rec lasix 10 mg qd prev hyperCa on  hctz  -renal US 10/21/14 mpr,a; cortical thickness and prominent pyramids   FH CAD, PAD   Facet block right C5/6, C6/7 and left C5/6 C6/7 09/27/16   Provider: Dr. Olivia Mackie McLean-Scocuzza-[facility]

## 2017-11-03 NOTE — Progress Notes (Signed)
Pre visit review using our clinic review tool, if applicable. No additional management support is needed unless otherwise documented below in the visit note. 

## 2017-11-03 NOTE — Patient Instructions (Addendum)
Rec Tdap  Please sch fasting labs and see my in 3-4 months sooner if needed   DTaP Vaccine (Diphtheria, Tetanus, and Pertussis): What You Need to Know 1. Why get vaccinated? Diphtheria, tetanus, and pertussis are serious diseases caused by bacteria. Diphtheria and pertussis are spread from person to person. Tetanus enters the body through cuts or wounds. DIPHTHERIA causes a thick covering in the back of the throat.  It can lead to breathing problems, paralysis, heart failure, and even death.  TETANUS (Lockjaw) causes painful tightening of the muscles, usually all over the body.  It can lead to "locking" of the jaw so the victim cannot open his mouth or swallow. Tetanus leads to death in up to 2 out of 10 cases.  PERTUSSIS (Whooping Cough) causes coughing spells so bad that it is hard for infants to eat, drink, or breathe. These spells can last for weeks.  It can lead to pneumonia, seizures (jerking and staring spells), brain damage, and death.  Diphtheria, tetanus, and pertussis vaccine (DTaP) can help prevent these diseases. Most children who are vaccinated with DTaP will be protected throughout childhood. Many more children would get these diseases if we stopped vaccinating. DTaP is a safer version of an older vaccine called DTP. DTP is no longer used in the [place]. 2. Who should get DTaP vaccine and when? Children should get 5 doses of DTaP vaccine, one dose at each of the following ages:  2 months  4 months  6 months  15-18 months  4-6 years  DTaP may be given at the same time as other vaccines. 3. Some children should not get DTaP vaccine or should wait  Children with minor illnesses, such as a cold, may be vaccinated. But children who are moderately or severely ill should usually wait until they recover before getting DTaP vaccine.  Any child who had a life-threatening allergic reaction after a dose of DTaP should not get another dose.  Any child who suffered a  brain or nervous system disease within 7 days after a dose of DTaP should not get another dose.  Talk with your doctor if your child: ? had a seizure or collapsed after a dose of DTaP, ? cried non-stop for 3 hours or more after a dose of DTaP, ? had a fever over 105F after a dose of DTaP. Ask your doctor for more information. Some of these children should not get another dose of pertussis vaccine, but may get a vaccine without pertussis, called DT. 4. Older children and adults DTaP is not licensed for adolescents, adults, or children 70 years of age and older. But older people still need protection. A vaccine called Tdap is similar to DTaP. A single dose of Tdap is recommended for people 11 through 70 years of age. Another vaccine, called Td, protects against tetanus and diphtheria, but not pertussis. It is recommended every 10 years. There are separate Vaccine Information Statements for these vaccines. 5. What are the risks from DTaP vaccine? Getting diphtheria, tetanus, or pertussis disease is much riskier than getting DTaP vaccine. However, a vaccine, like any medicine, is capable of causing serious problems, such as severe allergic reactions. The risk of DTaP vaccine causing serious harm, or death, is extremely small. Mild problems (common)  Fever (up to about 1 child in 4)  Redness or swelling where the shot was given (up to about 1 child in 4)  Soreness or tenderness where the shot was given (up to about 1 child in  4) These problems occur more often after the 4th and 5th doses of the DTaP series than after earlier doses. Sometimes the 4th or 5th dose of DTaP vaccine is followed by swelling of the entire arm or leg in which the shot was given, lasting 1-7 days (up to about 1 child in 58). Other mild problems include:  Fussiness (up to about 1 child in 3)  Tiredness or poor appetite (up to about 1 child in 10)  Vomiting (up to about 1 child in 97) These problems generally occur 1-3  days after the shot. Moderate problems (uncommon)  Seizure (jerking or staring) (about 1 child out of 14,000)  Non-stop crying, for 3 hours or more (up to about 1 child out of 1,000)  High fever, over 105F (about 1 child out of 16,000) Severe problems (very rare)  Serious allergic reaction (less than 1 out of a million doses)  Several other severe problems have been reported after DTaP vaccine. These include: ? Long-term seizures, coma, or lowered consciousness ? Permanent brain damage. These are so rare it is hard to tell if they are caused by the vaccine. Controlling fever is especially important for children who have had seizures, for any reason. It is also important if another family member has had seizures. You can reduce fever and pain by giving your child an aspirin-free pain reliever when the shot is given, and for the next 24 hours, following the package instructions. 6. What if there is a serious reaction? What should I look for? Look for anything that concerns you, such as signs of a severe allergic reaction, very high fever, or behavior changes. Signs of a severe allergic reaction can include hives, swelling of the face and throat, difficulty breathing, a fast heartbeat, dizziness, and weakness. These would start a few minutes to a few hours after the vaccination. What should I do?  If you think it is a severe allergic reaction or other emergency that can't wait, call 9-1-1 or get the person to the nearest hospital. Otherwise, call your doctor.  Afterward, the reaction should be reported to the Vaccine Adverse Event Reporting System (VAERS). Your doctor might file this report, or you can do it yourself through the VAERS web site at www.vaers.SamedayNews.es, or by calling 234-692-4169. ? VAERS is only for reporting reactions. They do not give medical advice. 7. The National Vaccine Injury Compensation Program The [employer] Vaccine Injury Compensation Program (VICP) is a federal  program that was created to compensate people who may have been injured by certain vaccines. Persons who believe they may have been injured by a vaccine can learn about the program and about filing a claim by calling (815)699-0030 or visiting the [employer] website at GoldCloset.com.ee. 8. How can I learn more?  Ask your doctor.  Call your local or state health department.  Contact the Centers for Disease Control and Prevention (CDC): ? Call (604)598-8456 (1-800-CDC-INFO) or ? Visit CDC's website at http://hunter.com/ CDC DTaP Vaccine (Diphtheria, Tetanus, and Pertussis) VIS (06/17/05) This information is not intended to replace advice given to you by your health care provider. Make sure you discuss any questions you have with your health care provider. Document Released: 11/15/2005 Document Revised: 10/09/2015 Document Reviewed: 10/09/2015 Elsevier Interactive Patient Education  2017 Else[employer] Inc.  Seborrheic Keratosis Seborrheic keratosis is a common, noncancerous (benign) skin growth. This condition causes waxy, rough, tan, brown, or black spots to appear on the skin. These skin growths can be flat or raised. What are the  causes? The cause of this condition is not known. What increases the risk? This condition is more likely to develop in:  People who have a family history of seborrheic keratosis.  People who are 41 or older.  People who are pregnant.  People who have had estrogen replacement therapy.  What are the signs or symptoms? This condition often occurs on the face, chest, shoulders, back, or other areas. These growths:  Are usually painless, but may become irritated and itchy.  Can be yellow, brown, black, or other colors.  Are slightly raised or have a flat surface.  Are sometimes rough or wart-like in texture.  Are often waxy on the surface.  Are round or oval-shaped.  Sometimes look like they are "stuck on."  Often occur in groups, but may  occur as a single growth.  How is this diagnosed? This condition is diagnosed with a medical history and physical exam. A sample of the growth may be tested (skin biopsy). You may need to see a skin specialist (dermatologist). How is this treated? Treatment is not usually needed for this condition, unless the growths are irritated or are often bleeding. You may also choose to have the growths removed if you do not like their appearance. Most commonly, these growths are treated with a procedure in which liquid nitrogen is applied to "freeze" off the growth (cryosurgery). They may also be burned off with electricity or cut off. Follow these instructions at home:  Watch your growth for any changes.  Keep all follow-up visits as told by your health care provider. This is important.  Do not scratch or pick at the growth or growths. This can cause them to become irritated or infected. Contact a health care provider if:  You suddenly have many new growths.  Your growth bleeds, itches, or hurts.  Your growth suddenly becomes larger or changes color. This information is not intended to replace advice given to you by your health care provider. Make sure you discuss any questions you have with your health care provider. Document Released: 02/20/2010 Document Revised: 06/26/2015 Document Reviewed: 06/05/2014 Elsevier Interactive Patient Education  2018 [employer].

## 2017-11-11 ENCOUNTER — Other Ambulatory Visit (INDEPENDENT_AMBULATORY_CARE_PROVIDER_SITE_OTHER): Payer: Medicare Other

## 2017-11-11 DIAGNOSIS — Z13818 Encounter for screening for other digestive system disorders: Secondary | ICD-10-CM

## 2017-11-11 DIAGNOSIS — Z0184 Encounter for antibody response examination: Secondary | ICD-10-CM | POA: Diagnosis not present

## 2017-11-11 DIAGNOSIS — R7303 Prediabetes: Secondary | ICD-10-CM

## 2017-11-11 DIAGNOSIS — E785 Hyperlipidemia, unspecified: Secondary | ICD-10-CM

## 2017-11-11 DIAGNOSIS — I10 Essential (primary) hypertension: Secondary | ICD-10-CM

## 2017-11-11 DIAGNOSIS — Z1329 Encounter for screening for other suspected endocrine disorder: Secondary | ICD-10-CM | POA: Diagnosis not present

## 2017-11-11 DIAGNOSIS — Z1159 Encounter for screening for other viral diseases: Secondary | ICD-10-CM

## 2017-11-11 DIAGNOSIS — E559 Vitamin D deficiency, unspecified: Secondary | ICD-10-CM

## 2017-11-11 LAB — CBC WITH DIFFERENTIAL/PLATELET
Basophils Absolute: 0.1 10*3/uL (ref 0.0–0.1)
Basophils Relative: 1.4 % (ref 0.0–3.0)
Eosinophils Absolute: 0.2 10*3/uL (ref 0.0–0.7)
Eosinophils Relative: 4.7 % (ref 0.0–5.0)
HCT: 39.8 % (ref 36.0–46.0)
Hemoglobin: 13.2 g/dL (ref 12.0–15.0)
Lymphocytes Relative: 44.3 % (ref 12.0–46.0)
Lymphs Abs: 2.1 10*3/uL (ref 0.7–4.0)
MCHC: 33.1 g/dL (ref 30.0–36.0)
MCV: 84.8 fl (ref 78.0–100.0)
Monocytes Absolute: 0.4 10*3/uL (ref 0.1–1.0)
Monocytes Relative: 7.7 % (ref 3.0–12.0)
Neutro Abs: 2 10*3/uL (ref 1.4–7.7)
Neutrophils Relative %: 41.9 % — ABNORMAL LOW (ref 43.0–77.0)
Platelets: 202 10*3/uL (ref 150.0–400.0)
RBC: 4.69 Mil/uL (ref 3.87–5.11)
RDW: 13.7 % (ref 11.5–15.5)
WBC: 4.7 10*3/uL (ref 4.0–10.5)

## 2017-11-11 LAB — COMPREHENSIVE METABOLIC PANEL
ALT: 18 U/L (ref 0–35)
AST: 19 U/L (ref 0–37)
Albumin: 4 g/dL (ref 3.5–5.2)
Alkaline Phosphatase: 54 U/L (ref 39–117)
BUN: 20 mg/dL (ref 6–23)
CO2: 30 mEq/L (ref 19–32)
Calcium: 9.7 mg/dL (ref 8.4–10.5)
Chloride: 105 mEq/L (ref 96–112)
Creatinine, Ser: 0.96 mg/dL (ref 0.40–1.20)
GFR: 60.94 mL/min (ref >60.00)
Glucose, Bld: 93 mg/dL (ref 70–99)
Potassium: 3.8 mEq/L (ref 3.5–5.1)
Sodium: 141 mEq/L (ref 135–145)
Total Bilirubin: 0.4 mg/dL (ref 0.2–1.2)
Total Protein: 6.4 g/dL (ref 6.0–8.3)

## 2017-11-11 LAB — LIPID PANEL
Cholesterol: 148 mg/dL (ref 0–200)
HDL: 66.7 mg/dL (ref >39.00)
LDL Cholesterol: 67 mg/dL (ref 0–99)
NonHDL: 81.7
Total CHOL/HDL Ratio: 2
Triglycerides: 72 mg/dL (ref 0.0–149.0)
VLDL: 14.4 mg/dL (ref 0.0–40.0)

## 2017-11-11 LAB — HEMOGLOBIN A1C: Hgb A1c MFr Bld: 6.4 % (ref 4.6–6.5)

## 2017-11-11 LAB — VITAMIN D 25 HYDROXY (VIT D DEFICIENCY, FRACTURES): VITD: 79.12 ng/mL (ref 30.00–100.00)

## 2017-11-11 LAB — TSH: TSH: 1.45 u[IU]/mL (ref 0.35–4.50)

## 2017-11-14 ENCOUNTER — Encounter: Payer: Self-pay | Admitting: Internal Medicine

## 2017-11-14 LAB — HEPATITIS B SURFACE ANTIBODY, QUANTITATIVE: Hepatitis B-Post: 21 m[IU]/mL (ref >=10)

## 2017-11-14 LAB — MEASLES/MUMPS/RUBELLA IMMUNITY
Mumps IgG: 185 AU/mL
Rubella: 20.1 index
Rubeola IgG: >300 AU/mL

## 2017-11-14 LAB — URINALYSIS, ROUTINE W REFLEX MICROSCOPIC
Bacteria, UA: NONE SEEN /HPF
Bilirubin Urine: NEGATIVE
Glucose, UA: NEGATIVE
Hgb urine dipstick: NEGATIVE
Hyaline Cast: NONE SEEN /LPF
Ketones, ur: NEGATIVE
Nitrite: NEGATIVE
Protein, ur: NEGATIVE
Specific Gravity, Urine: 1.014 (ref 1.001–1.03)
pH: 6.5 (ref 5.0–8.0)

## 2017-11-14 LAB — HEPATITIS C ANTIBODY
Hepatitis C Ab: NONREACTIVE
SIGNAL TO CUT-OFF: 0.01 (ref <1.00)

## 2017-11-15 ENCOUNTER — Other Ambulatory Visit: Payer: Self-pay | Admitting: Internal Medicine

## 2017-11-15 ENCOUNTER — Encounter: Payer: Self-pay | Admitting: Internal Medicine

## 2017-11-15 ENCOUNTER — Telehealth: Payer: Self-pay | Admitting: Internal Medicine

## 2017-11-15 DIAGNOSIS — M549 Dorsalgia, unspecified: Secondary | ICD-10-CM

## 2017-11-15 DIAGNOSIS — M546 Pain in thoracic spine: Secondary | ICD-10-CM

## 2017-11-15 DIAGNOSIS — G8929 Other chronic pain: Secondary | ICD-10-CM

## 2017-11-15 DIAGNOSIS — N183 Chronic kidney disease, stage 3 unspecified: Secondary | ICD-10-CM | POA: Insufficient documentation

## 2017-11-15 DIAGNOSIS — M542 Cervicalgia: Secondary | ICD-10-CM

## 2017-11-15 MED ORDER — DULOXETINE HCL 30 MG PO CPEP: 30.0000 mg | ORAL_CAPSULE | Freq: Every day | ORAL | 3 refills | Status: DC

## 2017-11-15 MED ORDER — CYCLOBENZAPRINE HCL 5 MG PO TABS: 5.0000 mg | ORAL_TABLET | Freq: Every evening | ORAL | 0 refills | Status: DC | PRN

## 2017-11-15 NOTE — Telephone Encounter (Signed)
We can try to do flexeril at night never baclofen, zanaflex all together   [place]

## 2017-11-15 NOTE — Telephone Encounter (Signed)
Inform pt zanaflex muscle relaxer not on list of approved drugs per insurance  Does she want to try a different muscle relaxer?  They have granted her a temporary supply   [name]

## 2017-11-15 NOTE — Telephone Encounter (Signed)
Patient has been notified. She is ok with changing it.  In the past she has taken baclofen,  She doesn't use very often.  Is this something she can take?

## 2017-11-16 ENCOUNTER — Other Ambulatory Visit: Payer: Self-pay | Admitting: Internal Medicine

## 2017-11-16 ENCOUNTER — Encounter: Payer: Self-pay | Admitting: Internal Medicine

## 2017-11-16 DIAGNOSIS — M545 Low back pain, unspecified: Secondary | ICD-10-CM

## 2017-11-16 DIAGNOSIS — I10 Essential (primary) hypertension: Secondary | ICD-10-CM

## 2017-11-16 DIAGNOSIS — M542 Cervicalgia: Secondary | ICD-10-CM

## 2017-11-16 DIAGNOSIS — E785 Hyperlipidemia, unspecified: Secondary | ICD-10-CM

## 2017-11-16 DIAGNOSIS — G8929 Other chronic pain: Secondary | ICD-10-CM

## 2017-11-16 MED ORDER — LISINOPRIL 20 MG PO TABS: 20.0000 mg | ORAL_TABLET | Freq: Every day | ORAL | 3 refills | Status: DC

## 2017-11-16 MED ORDER — FUROSEMIDE 20 MG PO TABS: 10.0000 mg | ORAL_TABLET | Freq: Every day | ORAL | 3 refills | Status: DC | PRN

## 2017-11-16 MED ORDER — ROSUVASTATIN CALCIUM 40 MG PO TABS: 40.0000 mg | ORAL_TABLET | Freq: Every day | ORAL | 3 refills | Status: DC

## 2017-11-16 MED ORDER — TRAMADOL HCL 50 MG PO TABS: 50.0000 mg | ORAL_TABLET | Freq: Two times a day (BID) | ORAL | 2 refills | Status: DC | PRN

## 2017-11-17 ENCOUNTER — Encounter: Payer: Self-pay | Admitting: Internal Medicine

## 2017-11-17 ENCOUNTER — Other Ambulatory Visit: Payer: Self-pay | Admitting: Internal Medicine

## 2017-11-17 DIAGNOSIS — M542 Cervicalgia: Secondary | ICD-10-CM

## 2017-11-17 DIAGNOSIS — G8929 Other chronic pain: Secondary | ICD-10-CM

## 2017-11-17 DIAGNOSIS — M545 Low back pain, unspecified: Secondary | ICD-10-CM

## 2017-11-17 MED ORDER — BACLOFEN 10 MG PO TABS: 10.0000 mg | ORAL_TABLET | Freq: Every evening | ORAL | 0 refills | Status: DC | PRN

## 2017-11-17 NOTE — Telephone Encounter (Signed)
Patient agreed to try Baclofen if insurance will approve.

## 2017-11-17 NOTE — Telephone Encounter (Signed)
Sent baclofen to mail order pharmacy   [place]

## 2017-11-18 ENCOUNTER — Telehealth: Payer: Self-pay | Admitting: Internal Medicine

## 2017-11-18 ENCOUNTER — Encounter: Payer: Self-pay | Admitting: Internal Medicine

## 2017-11-18 NOTE — Telephone Encounter (Signed)
I sent this to your pharmacy will have RN call  Call local pharmacy [facility] I think and advise pt under pain contract please release full quantity of tramadol   Thanks [place] ===View-only below this line===   ----- Message -----    From: [name]    Sent: 11/17/2017 10:54 PM EDT      To: Nino Glow McLean-Scocuzza, MD Subject: Non-Urgent Medical Question  Dr. Terese Door: I have already asked this, but with all my emails wonder if it had been missed, or if you were waiting on approval.  Sorry another issue Tramadol. Unless the Doctor sends in a prior approval, they will only give me 14 pills at a time.  [facility] has a prescription for 30 pills, but [facility] will only give 14 pills at a time. Can you send a prior approval? Thanks again.

## 2017-11-21 ENCOUNTER — Encounter: Payer: Self-pay | Admitting: Internal Medicine

## 2017-11-21 NOTE — Telephone Encounter (Signed)
Patient needs prior authorization for medication, needs Part D card on file , patient to scan to office,Via My chart.

## 2017-11-25 NOTE — Telephone Encounter (Signed)
Started cover my meds.

## 2017-11-29 ENCOUNTER — Encounter: Payer: Self-pay | Admitting: Internal Medicine

## 2017-11-30 ENCOUNTER — Encounter: Payer: Self-pay | Admitting: Internal Medicine

## 2017-11-30 ENCOUNTER — Other Ambulatory Visit: Payer: Self-pay | Admitting: Internal Medicine

## 2017-11-30 DIAGNOSIS — B009 Herpesviral infection, unspecified: Secondary | ICD-10-CM

## 2017-11-30 MED ORDER — VALACYCLOVIR HCL 1 G PO TABS: 1000.0000 mg | ORAL_TABLET | Freq: Two times a day (BID) | ORAL | 12 refills | Status: DC

## 2017-12-02 ENCOUNTER — Ambulatory Visit
Admission: RE | Admit: 2017-12-02 | Discharge: 2017-12-02 | Disposition: A | Discharge status: Home / Self Care | Payer: Medicare Other | Source: Ambulatory Visit | Attending: Internal Medicine | Admitting: Internal Medicine

## 2017-12-02 ENCOUNTER — Encounter: Payer: Self-pay | Admitting: Radiology

## 2017-12-02 DIAGNOSIS — Z1231 Encounter for screening mammogram for malignant neoplasm of breast: Secondary | ICD-10-CM | POA: Insufficient documentation

## 2018-01-12 NOTE — Progress Notes (Deleted)
Cardiology Office Note  Date:  01/12/2018   ID:  Patricia Curtis, DOB [birth year], MRN [id number]  PCP:  McLean-Scocuzza, Nino Glow, MD   No chief complaint on file.   HPI:    1. Chronic neck and mid back pain since 2005 had facet injections x 2 was seeing pain clinic on tramadol 50 mg 1-2 x per day prn, cymbalta 30 mg which helps she reports tense muscles in her back has old Rx baclofen has passed out 2/2 pain in neck and back prev.  2. Prediabetes diet controlled  3. Strong FH cardiac disease no chest pain  4. HTN/HLD. BP controlled on lasix 10 and lis 10 mg qd and on crestor 40 mg qd     PMH:   has a past medical history of Arthritis, Family history of heart attack, Herpes, Hyperlipidemia, Hypertension, Kidney disease, Prediabetes, Syncope, and Vitamin D deficiency.  PSH:    Past Surgical History:  Procedure Laterality Date  . ABDOMINAL HYSTERECTOMY     age 72 y.o ? reason per pt DUB no h/o abnormal pap ovaries intact   . APPENDECTOMY     1963  . BREAST CYST ASPIRATION Right 1994  . BREAST SURGERY     bx right breast 2001 benign     Current Outpatient Medications  Medication Sig Dispense Refill  . aspirin EC 81 MG tablet Take by mouth daily.     . baclofen (LIORESAL) 10 MG tablet Take 1 tablet (10 mg total) by mouth at bedtime as needed for muscle spasms. 90 each 0  . Cholecalciferol 50000 units TABS Take by mouth daily.    . cycloSPORINE (RESTASIS) 0.05 % ophthalmic emulsion 1 drop 2 (two) times daily.    . DULoxetine (CYMBALTA) 30 MG capsule Take 1 capsule (30 mg total) by mouth daily. 90 capsule 3  . furosemide (LASIX) 20 MG tablet Take 0.5 tablets (10 mg total) by mouth daily as needed. Prn 45 tablet 3  . lisinopril (PRINIVIL,ZESTRIL) 20 MG tablet Take 1 tablet (20 mg total) by mouth daily. 90 tablet 3  . Melatonin 3 MG TABS Take by mouth daily.     . rosuvastatin (CRESTOR) 40 MG tablet Take 1 tablet (40 mg total) by mouth daily. 90 tablet 3  . traMADol (ULTRAM) 50 MG  tablet Take 1 tablet (50 mg total) by mouth every 12 (twelve) hours as needed. 60 tablet 2  . valACYclovir (VALTREX) 1000 MG tablet Take 1 tablet (1,000 mg total) by mouth 2 (two) times daily. X 7-10 days outbreak 30 tablet 12   No current facility-administered medications for this visit.      Allergies:   Patient has no known allergies.   Social History:  The patient  reports that she has never smoked. She has never used smokeless tobacco. She reports previous alcohol use. She reports previous drug use.   Family History:   family history includes Cancer in her brother, brother, and father; Early death in her brother; Heart disease in her brother, brother, brother, maternal grandfather, and mother; Hyperlipidemia in her brother, brother, and son; Hypertension in her brother and brother; Miscarriages / [place] in her daughter; Stroke in her brother.    Review of Systems: ROS   PHYSICAL EXAM: VS:  There were no vitals taken for this visit. , BMI There is no height or weight on file to calculate BMI. GEN: Well nourished, well developed, in no acute distress HEENT: normal Neck: no JVD, carotid bruits, or masses Cardiac: RRR; no  murmurs, rubs, or gallops,no edema  Respiratory:  clear to auscultation bilaterally, normal work of breathing GI: soft, nontender, nondistended, + BS MS: no deformity or atrophy Skin: warm and dry, no rash Neuro:  Strength and sensation are intact Psych: euthymic mood, full affect    Recent Labs: 11/11/2017: ALT 18; BUN 20; Creatinine, Ser 0.96; Hemoglobin 13.2; Platelets 202.0; Potassium 3.8; Sodium 141; TSH 1.45    Lipid Panel Lab Results  Component Value Date   CHOL 148 11/11/2017   HDL 66.70 11/11/2017   LDLCALC 67 11/11/2017   TRIG 72.0 11/11/2017      Wt Readings from Last 3 Encounters:  11/03/17 149 lb (67.6 kg)       ASSESSMENT AND PLAN:  No diagnosis found.   Disposition:   F/U  6 months  No orders of the defined types were  placed in this encounter.    Signed, Esmond Plants, M.D., Ph.D. 01/12/2018  [place], [place]

## 2018-01-13 ENCOUNTER — Ambulatory Visit: Payer: Self-pay | Admitting: Cardiovascular Disease

## 2018-01-23 ENCOUNTER — Encounter: Payer: Self-pay | Admitting: Internal Medicine

## 2018-01-31 ENCOUNTER — Ambulatory Visit: Payer: Medicare Other | Admitting: Family Medicine

## 2018-02-01 ENCOUNTER — Encounter: Payer: Self-pay | Admitting: Internal Medicine

## 2018-02-07 ENCOUNTER — Other Ambulatory Visit: Payer: Self-pay | Admitting: Internal Medicine

## 2018-02-07 ENCOUNTER — Encounter: Payer: Self-pay | Admitting: Internal Medicine

## 2018-02-07 DIAGNOSIS — M542 Cervicalgia: Secondary | ICD-10-CM

## 2018-02-07 DIAGNOSIS — M545 Low back pain, unspecified: Secondary | ICD-10-CM

## 2018-02-07 DIAGNOSIS — G8929 Other chronic pain: Secondary | ICD-10-CM

## 2018-02-07 MED ORDER — BACLOFEN 10 MG PO TABS: 10.0000 mg | ORAL_TABLET | Freq: Every evening | ORAL | 3 refills | Status: DC | PRN

## 2018-02-08 ENCOUNTER — Encounter: Payer: Self-pay | Admitting: Internal Medicine

## 2018-02-09 ENCOUNTER — Other Ambulatory Visit: Payer: Self-pay | Admitting: Internal Medicine

## 2018-02-09 DIAGNOSIS — E2839 Other primary ovarian failure: Secondary | ICD-10-CM

## 2018-03-08 ENCOUNTER — Other Ambulatory Visit: Payer: Medicare Other

## 2018-03-10 ENCOUNTER — Encounter: Payer: Self-pay | Admitting: Internal Medicine

## 2018-03-10 ENCOUNTER — Ambulatory Visit (INDEPENDENT_AMBULATORY_CARE_PROVIDER_SITE_OTHER): Payer: Medicare Other | Admitting: Internal Medicine

## 2018-03-10 VITALS — BP 142/94 | HR 71 | Temp 98.3°F | Ht 64.5 in | Wt 152.0 lb

## 2018-03-10 DIAGNOSIS — N952 Postmenopausal atrophic vaginitis: Secondary | ICD-10-CM

## 2018-03-10 DIAGNOSIS — R05 Cough: Secondary | ICD-10-CM | POA: Diagnosis not present

## 2018-03-10 DIAGNOSIS — M545 Low back pain, unspecified: Secondary | ICD-10-CM

## 2018-03-10 DIAGNOSIS — R059 Cough, unspecified: Secondary | ICD-10-CM

## 2018-03-10 DIAGNOSIS — I10 Essential (primary) hypertension: Secondary | ICD-10-CM

## 2018-03-10 DIAGNOSIS — M542 Cervicalgia: Secondary | ICD-10-CM

## 2018-03-10 DIAGNOSIS — J40 Bronchitis, not specified as acute or chronic: Secondary | ICD-10-CM | POA: Diagnosis not present

## 2018-03-10 DIAGNOSIS — G8929 Other chronic pain: Secondary | ICD-10-CM

## 2018-03-10 MED ORDER — ALBUTEROL SULFATE (2.5 MG/3ML) 0.083% IN NEBU
2.5000 mg | INHALATION_SOLUTION | Freq: Once | RESPIRATORY_TRACT | Status: AC
  Administered 2018-03-10: 2.5 mg via RESPIRATORY_TRACT

## 2018-03-10 MED ORDER — IPRATROPIUM BROMIDE 0.02 % IN SOLN
0.5000 mg | Freq: Once | RESPIRATORY_TRACT | Status: AC
  Administered 2018-03-10: 0.5 mg via RESPIRATORY_TRACT

## 2018-03-10 MED ORDER — HYDROCOD POLST-CPM POLST ER 10-8 MG/5ML PO SUER: 5.0000 mL | Freq: Every evening | ORAL | 0 refills | Status: DC | PRN

## 2018-03-10 MED ORDER — LOSARTAN POTASSIUM 25 MG PO TABS: 25.0000 mg | ORAL_TABLET | Freq: Every day | ORAL | 3 refills | Status: DC

## 2018-03-10 MED ORDER — ESTRADIOL 0.1 MG/GM VA CREA: 1.0000 | TOPICAL_CREAM | Freq: Every day | VAGINAL | 0 refills | Status: DC

## 2018-03-10 MED ORDER — TRAMADOL HCL 50 MG PO TABS: 50.0000 mg | ORAL_TABLET | Freq: Two times a day (BID) | ORAL | 2 refills | Status: DC | PRN

## 2018-03-10 NOTE — Patient Instructions (Addendum)
Call back if cough not improved after starting losartan instead of lisinopril w/in the next 2 weeks or my chart    Atrophic Vaginitis  Atrophic vaginitis is a condition in which the tissues that line the vagina become dry and thin. This condition is most common in women who have stopped having regular menstrual periods (are in menopause). This usually starts when a woman is 32-71 years old. That is the time when a woman's estrogen levels begin to drop (decrease). Estrogen is a female hormone. It helps to keep the tissues of the vagina moist. It stimulates the vagina to produce a clear fluid that lubricates the vagina for sexual intercourse. This fluid also protects the vagina from infection. Lack of estrogen can cause the lining of the vagina to get thinner and dryer. The vagina may also shrink in size. It may become less elastic. Atrophic vaginitis tends to get worse over time as a woman's estrogen level drops. What are the causes? This condition is caused by the normal drop in estrogen that happens around the time of menopause. What increases the risk? Certain conditions or situations may lower a woman's estrogen level, leading to a higher risk for atrophic vaginitis. You are more likely to develop this condition if:  You are taking medicines that block estrogen.  You have had your ovaries removed.  You are being treated for cancer with X-ray (radiation) or medicines (chemotherapy).  You have given birth or are breastfeeding.  You are older than age 5.  You smoke. What are the signs or symptoms? Symptoms of this condition include:  Pain, soreness, or bleeding during sexual intercourse (dyspareunia).  Vaginal burning, irritation, or itching.  Pain or bleeding when a speculum is used in a vaginal exam (pelvic exam).  Having burning pain when passing urine.  Vaginal discharge that is brown or yellow. In some cases, there are no symptoms. How is this diagnosed? This condition is  diagnosed by taking a medical history and doing a physical exam. This will include a pelvic exam that checks the vaginal tissues. Though rare, you may also have other tests, including:  A urine test.  A test that checks the acid balance in your vagina (acid balance test). How is this treated? Treatment for this condition depends on how severe your symptoms are. Treatment may include:  Using an over-the-counter vaginal lubricant before sex.  Using a long-acting vaginal moisturizer.  Using low-dose vaginal estrogen for moderate to severe symptoms that do not respond to other treatments. Options include creams, tablets, and inserts (vaginal rings). Before you use a vaginal estrogen, tell your health care provider if you have a history of: ? Breast cancer. ? Endometrial cancer. ? Blood clots. If you are not sexually active and your symptoms are very mild, you may not need treatment. Follow these instructions at home: Medicines  Take over-the-counter and prescription medicines only as told by your health care provider. Do not use herbal or alternative medicines unless your health care provider says that you can.  Use over-the-counter creams, lubricants, or moisturizers for dryness only as directed by your health care provider. General instructions  If your atrophic vaginitis is caused by menopause, discuss all of your menopause symptoms and treatment options with your health care provider.  Do not douche.  Do not use products that can make your vagina dry. These include: ? Scented feminine sprays. ? Scented tampons. ? Scented soaps.  Vaginal intercourse can help to improve blood flow and elasticity of vaginal tissue.  If it hurts to have sex, try using a lubricant or moisturizer just before having intercourse. Contact a health care provider if:  Your discharge looks different than normal.  Your vagina has an unusual smell.  You have new symptoms.  Your symptoms do not improve  with treatment.  Your symptoms get worse. Summary  Atrophic vaginitis is a condition in which the tissues that line the vagina become dry and thin. It is most common in women who have stopped having regular menstrual periods (are in menopause).  Treatment options include using vaginal lubricants and low-dose vaginal estrogen.  Contact a health care provider if your vagina has an unusual smell, or if your symptoms get worse or do not improve after treatment. This information is not intended to replace advice given to you by your health care provider. Make sure you discuss any questions you have with your health care provider. Document Released: 06/04/2014 Document Revised: 10/14/2016 Document Reviewed: 10/14/2016 Elsevier Interactive Patient Education  2019 [employer]  Acute Bronchitis, Adult  Acute bronchitis is sudden (acute) swelling of the air tubes (bronchi) in the lungs. Acute bronchitis causes these tubes to fill with mucus, which can make it hard to breathe. It can also cause coughing or wheezing. In adults, acute bronchitis usually goes away within 2 weeks. A cough caused by bronchitis may last up to 3 weeks. Smoking, allergies, and asthma can make the condition worse. Repeated episodes of bronchitis may cause further lung problems, such as chronic obstructive pulmonary disease (COPD). What are the causes? This condition can be caused by germs and by substances that irritate the lungs, including:  Cold and flu viruses. This condition is most often caused by the same virus that causes a cold.  Bacteria.  Exposure to tobacco smoke, dust, fumes, and air pollution. What increases the risk? This condition is more likely to develop in people who:  Have close contact with someone with acute bronchitis.  Are exposed to lung irritants, such as tobacco smoke, dust, fumes, and vapors.  Have a weak immune system.  Have a respiratory condition such as asthma. What are the signs or  symptoms? Symptoms of this condition include:  A cough.  Coughing up clear, yellow, or green mucus.  Wheezing.  Chest congestion.  Shortness of breath.  A fever.  Body aches.  Chills.  A sore throat. How is this diagnosed? This condition is usually diagnosed with a physical exam. During the exam, your health care provider may order tests, such as chest X-rays, to rule out other conditions. He or she may also:  Test a sample of your mucus for bacterial infection.  Check the level of oxygen in your blood. This is done to check for pneumonia.  Do a chest X-ray or lung function testing to rule out pneumonia and other conditions.  Perform blood tests. Your health care provider will also ask about your symptoms and medical history. How is this treated? Most cases of acute bronchitis clear up over time without treatment. Your health care provider may recommend:  Drinking more fluids. Drinking more makes your mucus thinner, which may make it easier to breathe.  Taking a medicine for a fever or cough.  Taking an antibiotic medicine.  Using an inhaler to help improve shortness of breath and to control a cough.  Using a cool mist vaporizer or humidifier to make it easier to breathe. Follow these instructions at home: Medicines  Take over-the-counter and prescription medicines only as told by your health  care provider.  If you were prescribed an antibiotic, take it as told by your health care provider. Do not stop taking the antibiotic even if you start to feel better. General instructions   Get plenty of rest.  Drink enough fluids to keep your urine pale yellow.  Avoid smoking and secondhand smoke. Exposure to cigarette smoke or irritating chemicals will make bronchitis worse. If you smoke and you need help quitting, ask your health care provider. Quitting smoking will help your lungs heal faster.  Use an inhaler, cool mist vaporizer, or humidifier as told by your  health care provider.  Keep all follow-up visits as told by your health care provider. This is important. How is this prevented? To lower your risk of getting this condition again:  Wash your hands often with soap and water. If soap and water are not available, use hand sanitizer.  Avoid contact with people who have cold symptoms.  Try not to touch your hands to your mouth, nose, or eyes.  Make sure to get the flu shot every year. Contact a health care provider if:  Your symptoms do not improve in 2 weeks of treatment. Get help right away if:  You cough up blood.  You have chest pain.  You have severe shortness of breath.  You become dehydrated.  You faint or keep feeling like you are going to faint.  You keep vomiting.  You have a severe headache.  Your fever or chills gets worse. This information is not intended to replace advice given to you by your health care provider. Make sure you discuss any questions you have with your health care provider. Document Released: 02/26/2004 Document Revised: 09/01/2016 Document Reviewed: 07/09/2015 Elsevier Interactive Patient Education  2019 [employer]  Cough, Adult  Coughing is a reflex that clears your throat and your airways. Coughing helps to heal and protect your lungs. It is normal to cough occasionally, but a cough that happens with other symptoms or lasts a long time may be a sign of a condition that needs treatment. A cough may last only 2-3 weeks (acute), or it may last longer than 8 weeks (chronic). What are the causes? Coughing is commonly caused by:  Breathing in substances that irritate your lungs.  A viral or bacterial respiratory infection.  Allergies.  Asthma.  Postnasal drip.  Smoking.  Acid backing up from the stomach into the esophagus (gastroesophageal reflux).  Certain medicines.  Chronic lung problems, including COPD (or rarely, lung cancer).  Other medical conditions such as heart  failure. Follow these instructions at home: Pay attention to any changes in your symptoms. Take these actions to help with your discomfort:  Take medicines only as told by your health care provider. ? If you were prescribed an antibiotic medicine, take it as told by your health care provider. Do not stop taking the antibiotic even if you start to feel better. ? Talk with your health care provider before you take a cough suppressant medicine.  Drink enough fluid to keep your urine clear or pale yellow.  If the air is dry, use a cold steam vaporizer or humidifier in your bedroom or your home to help loosen secretions.  Avoid anything that causes you to cough at work or at home.  If your cough is worse at night, try sleeping in a semi-upright position.  Avoid cigarette smoke. If you smoke, quit smoking. If you need help quitting, ask your health care provider.  Avoid caffeine.  Avoid alcohol.  Rest as needed. Contact a health care provider if:  You have new symptoms.  You cough up pus.  Your cough does not get better after 2-3 weeks, or your cough gets worse.  You cannot control your cough with suppressant medicines and you are losing sleep.  You develop pain that is getting worse or pain that is not controlled with pain medicines.  You have a fever.  You have unexplained weight loss.  You have night sweats. Get help right away if:  You cough up blood.  You have difficulty breathing.  Your heartbeat is very fast. This information is not intended to replace advice given to you by your health care provider. Make sure you discuss any questions you have with your health care provider. Document Released: 07/17/2010 Document Revised: 06/26/2015 Document Reviewed: 03/27/2014 Elsevier Interactive Patient Education  2019 [employer].

## 2018-03-10 NOTE — Progress Notes (Addendum)
Chief Complaint  Patient presents with  . Follow-up   F/u  1. Chronic cough x 2 months no h/o allergies no pets at home tried otc claritin tried nyquil qhs and cough daytime w/o relief mild PND. Was on lisinopril 20 mg qd  2. Chronic neck and back pain baclofen and tramadol prn helps no issues currently  3. C/o vaginal atrophy tried replens w/o relief  4. HTN on lis 20 mg qd   Review of Systems  Constitutional: Negative for weight loss.  HENT: Negative for hearing loss.        +some PND   Eyes: Negative for blurred vision.  Respiratory: Positive for cough. Negative for sputum production.   Cardiovascular: Negative for chest pain.  Gastrointestinal: Negative for abdominal pain.  Genitourinary:       +vaginal dryness   Skin: Negative for rash.  Neurological: Negative for headaches.  Psychiatric/Behavioral: Negative for memory loss.   Past Medical History:  Diagnosis Date  . Arthritis   . Family history of heart attack   . Herpes   . Hyperlipidemia   . Hypertension   . Kidney disease    not specified; [facility] 04/20/16   . Prediabetes   . Syncope    2/2 neck and back pain in the past  . Vitamin D deficiency    Past Surgical History:  Procedure Laterality Date  . ABDOMINAL HYSTERECTOMY     age 67 y.o ? reason per pt DUB no h/o abnormal pap ovaries intact   . APPENDECTOMY     1963  . BREAST CYST ASPIRATION Right 1994  . BREAST SURGERY     bx right breast 2001 benign    Family History  Problem Relation Age of Onset  . Heart disease Mother        MI  . Cancer Father        prostate to bone  . Early death Brother   . Heart disease Brother        MI  . Stroke Brother   . Miscarriages / [place] Daughter   . Hyperlipidemia Son   . Heart disease Maternal Grandfather   . Cancer Brother        multiple myeloma  . Heart disease Brother        MI  . Hyperlipidemia Brother   . Hypertension Brother   . Hypertension Brother   . Hyperlipidemia Brother    . Heart disease Brother        MI  . Cancer Brother        prostate   . Breast cancer Neg Hx    Social History   Socioeconomic History  . Marital status: Married    Spouse name: Not on file  . Number of children: Not on file  . Years of education: Not on file  . Highest education level: Not on file  Occupational History  . Not on file  Social Needs  . Financial resource strain: Not on file  . Food insecurity:    Worry: Not on file    Inability: Not on file  . Transportation needs:    Medical: Not on file    Non-medical: Not on file  Tobacco Use  . Smoking status: Never Smoker  . Smokeless tobacco: Never Used  Substance and Sexual Activity  . Alcohol use: Not Currently  . Drug use: Not Currently  . Sexual activity: Not Currently    Partners: Male  Lifestyle  . Physical activity:  Days per week: Not on file    Minutes per session: Not on file  . Stress: Not on file  Relationships  . Social connections:    Talks on phone: Not on file    Gets together: Not on file    Attends religious service: Not on file    Active member of club or organization: Not on file    Attends meetings of clubs or organizations: Not on file    Relationship status: Not on file  . Intimate partner violence:    Fear of current or ex partner: Not on file    Emotionally abused: Not on file    Physically abused: Not on file    Forced sexual activity: Not on file  Other Topics Concern  . Not on file  Social History Narrative   Married    2 kids son and daughter    HS ed    Press photographer, retired    No guns    Wears seat belt    Safe in relationship    Current Meds  Medication Sig  . aspirin EC 81 MG tablet Take by mouth daily.   . baclofen (LIORESAL) 10 MG tablet Take 1 tablet (10 mg total) by mouth at bedtime as needed for muscle spasms.  . Cholecalciferol 50000 units TABS Take by mouth daily.  . cycloSPORINE (RESTASIS) 0.05 % ophthalmic emulsion 1 drop 2 (two) times daily.  .  DULoxetine (CYMBALTA) 30 MG capsule Take 1 capsule (30 mg total) by mouth daily.  . furosemide (LASIX) 20 MG tablet Take 0.5 tablets (10 mg total) by mouth daily as needed. Prn  . Melatonin 3 MG TABS Take by mouth daily.   . rosuvastatin (CRESTOR) 40 MG tablet Take 1 tablet (40 mg total) by mouth daily.  . traMADol (ULTRAM) 50 MG tablet Take 1 tablet (50 mg total) by mouth every 12 (twelve) hours as needed.  . valACYclovir (VALTREX) 1000 MG tablet Take 1 tablet (1,000 mg total) by mouth 2 (two) times daily. X 7-10 days outbreak  . [DISCONTINUED] lisinopril (PRINIVIL,ZESTRIL) 20 MG tablet Take 1 tablet (20 mg total) by mouth daily.   No Known Allergies No results found for this or any previous visit (from the past 2160 hour(s)). Objective  Body mass index is 25.69 kg/m. Wt Readings from Last 3 Encounters:  03/10/18 152 lb (68.9 kg)  11/03/17 149 lb (67.6 kg)   Temp Readings from Last 3 Encounters:  03/10/18 98.3 F (36.8 C) (Oral)  11/03/17 98.5 F (36.9 C) (Oral)   BP Readings from Last 3 Encounters:  03/10/18 (!) 142/94  11/03/17 132/76   Pulse Readings from Last 3 Encounters:  03/10/18 71  11/03/17 69    Physical Exam Vitals signs and nursing note reviewed.  Constitutional:      Appearance: Normal appearance. She is well-developed, well-groomed and normal weight.  HENT:     Head: Normocephalic and atraumatic.     Nose: Nose normal.     Mouth/Throat:     Mouth: Mucous membranes are moist.     Pharynx: Oropharynx is clear.  Eyes:     Conjunctiva/sclera: Conjunctivae normal.     Pupils: Pupils are equal, round, and reactive to light.  Cardiovascular:     Rate and Rhythm: Normal rate and regular rhythm.     Heart sounds: Normal heart sounds.  Pulmonary:     Effort: Pulmonary effort is normal.     Breath sounds: Normal breath sounds. No wheezing.  Skin:  General: Skin is warm and dry.  Neurological:     General: No focal deficit present.     Mental Status: She is  alert and oriented to person, place, and time. Mental status is at baseline.     Gait: Gait normal.  Psychiatric:        Attention and Perception: Attention and perception normal.        Mood and Affect: Mood and affect normal.        Speech: Speech normal.        Behavior: Behavior normal. Behavior is cooperative.        Thought Content: Thought content normal.        Cognition and Memory: Cognition and memory normal.        Judgment: Judgment normal.     Assessment   1. Chronic cough x 2 months ? Med related ACEI less likely allergies no hx, no h/o GERD 2. Chronic neck pain and back pain  3. Vaginal atrophy  4. HTN 5. HM Plan   1. duoneb today x 1  If continues to CXR Change ACEI to ARB Tussionex  2. Prn baclofen and tramadol  3. Estrace to try 4. Change lis 20 to losartan 25 mg qd  5.  Flu shot utd Consider Tdap 01/03/2019 due  shingrix had 01/07/17 and 06/23/17  No proof of prevnar or pna 23  MMR, hep B immune  Hep C neg   Labs had 11/11/17   Mammogram 12/02/17 neg  colonoscopy 06/27/15 [place] [facility] -IH small polyp sessile tubular f/u in 3 years to 5 years  Never smoker  S/p hysterectomy DUB ovaries intact no h/o abnormal pap  DEXA sch 04/11/2018   [facility] [place] eye exam no DM retinopathy f/u in 1 year 06/01/2018   Provider: Dr. Olivia Mackie McLean-Scocuzza-[facility]

## 2018-03-10 NOTE — Progress Notes (Signed)
Pre visit review using our clinic review tool, if applicable. No additional management support is needed unless otherwise documented below in the visit note. 

## 2018-03-10 NOTE — Addendum Note (Signed)
Addended by: Orland Mustard on: 03/10/2018 11:27 AM   Modules accepted: Orders

## 2018-04-11 ENCOUNTER — Ambulatory Visit
Admission: RE | Admit: 2018-04-11 | Discharge: 2018-04-11 | Disposition: A | Discharge status: Home / Self Care | Payer: Medicare Other | Source: Ambulatory Visit | Attending: Internal Medicine | Admitting: Internal Medicine

## 2018-04-11 DIAGNOSIS — E2839 Other primary ovarian failure: Secondary | ICD-10-CM | POA: Diagnosis not present

## 2018-04-11 DIAGNOSIS — Z780 Asymptomatic menopausal state: Secondary | ICD-10-CM | POA: Diagnosis not present

## 2018-04-11 DIAGNOSIS — M8589 Other specified disorders of bone density and structure, multiple sites: Secondary | ICD-10-CM | POA: Diagnosis not present

## 2018-04-14 ENCOUNTER — Other Ambulatory Visit: Payer: Self-pay

## 2018-04-14 DIAGNOSIS — N952 Postmenopausal atrophic vaginitis: Secondary | ICD-10-CM

## 2018-04-14 MED ORDER — ESTRADIOL 0.1 MG/GM VA CREA: 1.0000 | TOPICAL_CREAM | Freq: Every day | VAGINAL | 0 refills | Status: DC

## 2018-05-01 ENCOUNTER — Encounter: Payer: Self-pay | Admitting: Internal Medicine

## 2018-05-23 ENCOUNTER — Other Ambulatory Visit: Payer: Self-pay | Admitting: Internal Medicine

## 2018-05-23 DIAGNOSIS — N952 Postmenopausal atrophic vaginitis: Secondary | ICD-10-CM

## 2018-05-23 MED ORDER — ESTRADIOL 0.1 MG/GM VA CREA: 1.0000 | TOPICAL_CREAM | Freq: Every day | VAGINAL | 5 refills | Status: DC

## 2018-06-01 DIAGNOSIS — H524 Presbyopia: Secondary | ICD-10-CM | POA: Diagnosis not present

## 2018-06-01 DIAGNOSIS — H40013 Open angle with borderline findings, low risk, bilateral: Secondary | ICD-10-CM | POA: Diagnosis not present

## 2018-06-01 LAB — HM DIABETES EYE EXAM

## 2018-07-11 ENCOUNTER — Ambulatory Visit (INDEPENDENT_AMBULATORY_CARE_PROVIDER_SITE_OTHER): Payer: Medicare Other | Admitting: Internal Medicine

## 2018-07-11 ENCOUNTER — Other Ambulatory Visit: Payer: Self-pay

## 2018-07-11 DIAGNOSIS — I10 Essential (primary) hypertension: Secondary | ICD-10-CM | POA: Diagnosis not present

## 2018-07-11 DIAGNOSIS — E785 Hyperlipidemia, unspecified: Secondary | ICD-10-CM | POA: Diagnosis not present

## 2018-07-11 DIAGNOSIS — E559 Vitamin D deficiency, unspecified: Secondary | ICD-10-CM

## 2018-07-11 DIAGNOSIS — G8929 Other chronic pain: Secondary | ICD-10-CM

## 2018-07-11 DIAGNOSIS — Z1231 Encounter for screening mammogram for malignant neoplasm of breast: Secondary | ICD-10-CM | POA: Diagnosis not present

## 2018-07-11 DIAGNOSIS — R7303 Prediabetes: Secondary | ICD-10-CM | POA: Diagnosis not present

## 2018-07-11 DIAGNOSIS — Z1329 Encounter for screening for other suspected endocrine disorder: Secondary | ICD-10-CM

## 2018-07-11 DIAGNOSIS — M545 Low back pain, unspecified: Secondary | ICD-10-CM

## 2018-07-11 DIAGNOSIS — M542 Cervicalgia: Secondary | ICD-10-CM | POA: Diagnosis not present

## 2018-07-11 MED ORDER — DULOXETINE HCL 30 MG PO CPEP: 30.0000 mg | ORAL_CAPSULE | Freq: Every day | ORAL | 3 refills | Status: DC

## 2018-07-11 MED ORDER — ROSUVASTATIN CALCIUM 40 MG PO TABS: 40.0000 mg | ORAL_TABLET | Freq: Every day | ORAL | 3 refills | Status: DC

## 2018-07-11 MED ORDER — TRAMADOL HCL 50 MG PO TABS: 50.0000 mg | ORAL_TABLET | Freq: Two times a day (BID) | ORAL | 2 refills | Status: DC | PRN

## 2018-07-11 MED ORDER — FUROSEMIDE 20 MG PO TABS: 10.0000 mg | ORAL_TABLET | Freq: Every day | ORAL | 11 refills | Status: DC | PRN

## 2018-07-11 NOTE — Progress Notes (Signed)
Virtual Visit via Video Note  I connected with Patricia Curtis  on 07/11/18 at 11:16 AM EDT by a video enabled telemedicine application and verified that I am speaking with the correct person using two identifiers.  Location patient: home Location provider:work  Persons participating in the virtual visit: patient, provider  I discussed the limitations of evaluation and management by telemedicine and the availability of in person appointments. The patient expressed understanding and agreed to proceed.   HPI: 1. HTN BP 130/80 on losartan 25 and prn lasix her cough stopped after stopping lisinopril  She does not want to change meds and monitor for now  2. dexa 04/11/18 osteopenia score -2.1 on calcium and vitamin D  3. She needs medication refills cymbalta, lasix, crestor and tramadol [facility] otherwise doing well no complaints  4. Prediabetes has not been watching diet during quarantine    ROS: See pertinent positives and negatives per HPI.  Past Medical History:  Diagnosis Date  . Arthritis   . Family history of heart attack   . Herpes   . Hyperlipidemia   . Hypertension   . Kidney disease    not specified; [facility]   . Prediabetes   . Syncope    2/2 neck and back pain in the past  . Vitamin D deficiency     Past Surgical History:  Procedure Laterality Date  . ABDOMINAL HYSTERECTOMY     age 29 y.o ? reason per pt DUB no h/o abnormal pap ovaries intact   . APPENDECTOMY     1963  . BREAST CYST ASPIRATION Right 1994  . BREAST SURGERY     bx right breast 2001 benign     Family History  Problem Relation Age of Onset  . Heart disease Mother        MI  . Cancer Father        prostate to bone  . Early death Brother   . Heart disease Brother        MI  . Stroke Brother   . Miscarriages / [place] Daughter   . Hyperlipidemia Son   . Heart disease Maternal Grandfather   . Cancer Brother        multiple myeloma  . Heart disease Brother    MI  . Hyperlipidemia Brother   . Hypertension Brother   . Hypertension Brother   . Hyperlipidemia Brother   . Heart disease Brother        MI  . Cancer Brother        prostate   . Breast cancer Neg Hx     SOCIAL HX: married    Current Outpatient Medications:  .  aspirin EC 81 MG tablet, Take by mouth daily. , Disp: , Rfl:  .  baclofen (LIORESAL) 10 MG tablet, Take 1 tablet (10 mg total) by mouth at bedtime as needed for muscle spasms., Disp: 90 each, Rfl: 3 .  Cholecalciferol 50000 units TABS, Take by mouth daily., Disp: , Rfl:  .  cycloSPORINE (RESTASIS) 0.05 % ophthalmic emulsion, 1 drop 2 (two) times daily., Disp: , Rfl:  .  DULoxetine (CYMBALTA) 30 MG capsule, Take 1 capsule (30 mg total) by mouth daily., Disp: 90 capsule, Rfl: 3 .  estradiol (ESTRACE) 0.1 MG/GM vaginal cream, Place 1 Applicatorful vaginally at bedtime. 1-3x per week as needed, Disp: 42.5 g, Rfl: 5 .  furosemide (LASIX) 20 MG tablet, Take 0.5 tablets (10 mg total) by mouth daily as needed. Prn, Disp:  45 tablet, Rfl: 11 .  losartan (COZAAR) 25 MG tablet, Take 1 tablet (25 mg total) by mouth daily., Disp: 90 tablet, Rfl: 3 .  Melatonin 3 MG TABS, Take by mouth daily. , Disp: , Rfl:  .  rosuvastatin (CRESTOR) 40 MG tablet, Take 1 tablet (40 mg total) by mouth daily., Disp: 90 tablet, Rfl: 3 .  traMADol (ULTRAM) 50 MG tablet, Take 1 tablet (50 mg total) by mouth every 12 (twelve) hours as needed., Disp: 60 tablet, Rfl: 2 .  valACYclovir (VALTREX) 1000 MG tablet, Take 1 tablet (1,000 mg total) by mouth 2 (two) times daily. X 7-10 days outbreak, Disp: 30 tablet, Rfl: 12  EXAM:  VITALS per patient if applicable:  GENERAL: alert, oriented, appears well and in no acute distress  HEENT: atraumatic, conjunttiva clear, no obvious abnormalities on inspection of external nose and ears  NECK: normal movements of the head and neck  LUNGS: on inspection no signs of respiratory distress, breathing rate appears normal, no  obvious gross SOB, gasping or wheezing  CV: no obvious cyanosis  MS: moves all visible extremities without noticeable abnormality  PSYCH/NEURO: pleasant and cooperative, no obvious depression or anxiety, speech and thought processing grossly intact  ASSESSMENT AND PLAN:  Discussed the following assessment and plan:  Chronic low back pain, unspecified back pain laterality, unspecified whether sciatica present - Plan: traMADol (ULTRAM) 50 MG tablet Other chronic pain - Plan: DULoxetine (CYMBALTA) 30 MG capsule  Essential hypertension - Plan: furosemide (LASIX) 20 MG tablet, Comprehensive metabolic panel, Lipid panel, CBC w/Diff, Urinalysis, Routine w reflex microscopic  Hyperlipidemia, unspecified hyperlipidemia type - Plan: rosuvastatin (CRESTOR) 40 MG tablet, Lipid panel  Chronic neck pain - Plan: traMADol (ULTRAM) 50 MG tablet  Prediabetes - Plan: Hemoglobin A1c rec healthy diet and exercise   Thyroid disorder screening - Plan: TSH  Vitamin D deficiency - Plan: Vitamin D (25 hydroxy)  HM Flu shot utd Consider Tdap 01/03/2019 due  shingrix had 01/07/17 and 06/23/17  No proof of prevnar or pna 23  MMR, hep B immune  Hep C neg   Labs had 11/11/17 sch fasting labs 11/12/2018  Mammogram 12/02/17 neg  colonoscopy 06/27/15 [place] [facility] small polyp sessile tubular f/u in 3 yearsto 5 years Never smoker  S/p hysterectomy DUB ovaries intact no h/o abnormal pap  DEXA sch 04/11/2018 +osteopenia T score -2.1 rec calcium and vitamin D   [facility] [place] eye exam no DM retinopathy f/u in 1 year 06/01/2018     I discussed the assessment and treatment plan with the patient. The patient was provided an opportunity to ask questions and all were answered. The patient agreed with the plan and demonstrated an understanding of the instructions.   The patient was advised to call back or seek an in-person evaluation if the symptoms worsen or if the  condition fails to improve as anticipated.  Time spent 25 minutes  Delorise Jackson, MD

## 2018-07-25 ENCOUNTER — Encounter: Payer: Self-pay | Admitting: Internal Medicine

## 2018-08-01 ENCOUNTER — Encounter: Payer: Self-pay | Admitting: Internal Medicine

## 2018-08-14 ENCOUNTER — Encounter: Payer: Self-pay | Admitting: Internal Medicine

## 2018-08-15 ENCOUNTER — Other Ambulatory Visit: Payer: Self-pay | Admitting: Internal Medicine

## 2018-08-15 DIAGNOSIS — I10 Essential (primary) hypertension: Secondary | ICD-10-CM

## 2018-08-15 MED ORDER — LOSARTAN POTASSIUM 50 MG PO TABS: 50.0000 mg | ORAL_TABLET | Freq: Every day | ORAL | 3 refills | Status: DC

## 2018-08-16 ENCOUNTER — Other Ambulatory Visit: Payer: Self-pay | Admitting: Internal Medicine

## 2018-08-16 DIAGNOSIS — I10 Essential (primary) hypertension: Secondary | ICD-10-CM

## 2018-08-16 DIAGNOSIS — Z8249 Family history of ischemic heart disease and other diseases of the circulatory system: Secondary | ICD-10-CM

## 2018-08-16 DIAGNOSIS — E785 Hyperlipidemia, unspecified: Secondary | ICD-10-CM

## 2018-08-24 ENCOUNTER — Encounter: Payer: Self-pay | Admitting: Internal Medicine

## 2018-10-09 ENCOUNTER — Encounter: Payer: Self-pay | Admitting: Internal Medicine

## 2018-10-17 ENCOUNTER — Encounter: Payer: Self-pay | Admitting: Cardiovascular Disease

## 2018-10-17 ENCOUNTER — Other Ambulatory Visit: Payer: Self-pay

## 2018-10-17 ENCOUNTER — Ambulatory Visit (INDEPENDENT_AMBULATORY_CARE_PROVIDER_SITE_OTHER): Payer: Medicare Other | Admitting: Cardiovascular Disease

## 2018-10-17 VITALS — BP 132/80 | HR 83 | Ht 64.0 in | Wt 154.0 lb

## 2018-10-17 DIAGNOSIS — Z136 Encounter for screening for cardiovascular disorders: Secondary | ICD-10-CM

## 2018-10-17 DIAGNOSIS — E785 Hyperlipidemia, unspecified: Secondary | ICD-10-CM

## 2018-10-17 DIAGNOSIS — I10 Essential (primary) hypertension: Secondary | ICD-10-CM

## 2018-10-17 NOTE — Patient Instructions (Signed)
Medication Instructions:  Your physician recommends that you continue on your current medications as directed. Please refer to the Current Medication list given to you today.  If you need a refill on your cardiac medications before your next appointment, please call your pharmacy.   Lab work: None ordered If you have labs (blood work) drawn today and your tests are completely normal, you will receive your results only by: Marland Kitchen MyChart Message (if you have MyChart) OR . A paper copy in the mail If you have any lab test that is abnormal or we need to change your treatment, we will call you to review the results.  Testing/Procedures: None ordered  Follow-Up: At [facility], you and your health needs are our priority.  As part of our continuing mission to provide you with exceptional heart care, we have created designated Provider Care Teams.  These Care Teams include your primary Cardiologist (physician) and Advanced Practice Providers (APPs -  Physician Assistants and Nurse Practitioners) who all work together to provide you with the care you need, when you need it. You will need a follow up appointment as needed  You may see Dr. Fletcher Anon  or one of the following Advanced Practice Providers on your designated Care Team:   Murray Hodgkins, NP Christell Faith, PA-C . Marrianne Mood, PA-C  Any Other Special Instructions Will Be Listed Below (If Applicable). N/A

## 2018-10-17 NOTE — Progress Notes (Signed)
Cardiology Office Note   Date:  10/17/2018   ID:  Patricia Curtis, DOB 10/10/1945, MRN [id number]  PCP:  McLean-Scocuzza, Nino Glow, MD  Cardiologist:   Kathlyn Sacramento, MD   Chief Complaint  Patient presents with  . New Patient (Initial Visit)    Heart disease, hyperlipid, essential HTN. Medications reviewed verbally.      History of Present Illness:  Patricia Curtis is a 73 y.o. female who was referred by Dr. Terese Door for evaluation of hypertension and cardiovascular risk assessment.   She has known history of essential hypertension, hyperlipidemia and diet-controlled diabetes.  She is a lifelong non-smoker.  She has strong family history of premature coronary artery disease involving almost most family members.  She had hypertension for years and was switched recently from lisinopril to losartan due to cough which has resolved.  Her husband had CABG recently and since then she has been concerned especially with her family history of heart disease.  She denies any chest pain, shortness of breath or palpitations.  She tries to follow healthy lifestyle overall.  She walks 3 miles daily for exercise and has no exertional symptoms.  She takes her medications regularly.  Her hyperlipidemia improved significantly with high-dose rosuvastatin.  She does take low-dose aspirin daily.   Past Medical History:  Diagnosis Date  . Arthritis   . Family history of heart attack   . Herpes   . Hyperlipidemia   . Hypertension   . Kidney disease    not specified; [facility]   . Prediabetes   . Syncope    2/2 neck and back pain in the past  . Vitamin D deficiency     Past Surgical History:  Procedure Laterality Date  . ABDOMINAL HYSTERECTOMY     age 81 y.o ? reason per pt DUB no h/o abnormal pap ovaries intact   . APPENDECTOMY     1963  . BREAST CYST ASPIRATION Right 1994  . BREAST SURGERY     bx right breast 2001 benign      Current Outpatient Medications  Medication  Sig Dispense Refill  . Ascorbic Acid (VITAMIN C) 1000 MG tablet Take 1,000 mg by mouth daily.    Marland Kitchen aspirin EC 81 MG tablet Take by mouth daily.     . baclofen (LIORESAL) 10 MG tablet Take 1 tablet (10 mg total) by mouth at bedtime as needed for muscle spasms. 90 each 3  . calcium carbonate (OSCAL) 1500 (600 Ca) MG TABS tablet Take 600 mg of elemental calcium by mouth 2 (two) times daily with a meal.    . Cholecalciferol (VITAMIN D-3) 125 MCG (5000 UT) TABS Take by mouth.    . cycloSPORINE (RESTASIS) 0.05 % ophthalmic emulsion 1 drop 2 (two) times daily.    . DULoxetine (CYMBALTA) 30 MG capsule Take 1 capsule (30 mg total) by mouth daily. 90 capsule 3  . estradiol (ESTRACE) 0.1 MG/GM vaginal cream Place 1 Applicatorful vaginally at bedtime. 1-3x per week as needed 42.5 g 5  . furosemide (LASIX) 20 MG tablet Take 0.5 tablets (10 mg total) by mouth daily as needed. Prn 45 tablet 11  . losartan (COZAAR) 50 MG tablet Take 1 tablet (50 mg total) by mouth daily. 90 tablet 3  . Melatonin 3 MG TABS Take by mouth daily.     . rosuvastatin (CRESTOR) 40 MG tablet Take 1 tablet (40 mg total) by mouth daily. 90 tablet 3  . traMADol (ULTRAM) 50 MG tablet Take  1 tablet (50 mg total) by mouth every 12 (twelve) hours as needed. 60 tablet 2  . valACYclovir (VALTREX) 1000 MG tablet Take 1 tablet (1,000 mg total) by mouth 2 (two) times daily. X 7-10 days outbreak 30 tablet 12   No current facility-administered medications for this visit.     Allergies:   Lisinopril    Social History:  The patient  reports that she has never smoked. She has never used smokeless tobacco. She reports previous alcohol use. She reports previous drug use.   Family History:  The patient's family history includes Cancer in her brother, brother, and father; Early death in her brother; Heart disease in her brother, brother, brother, maternal grandfather, and mother; Hyperlipidemia in her brother, brother, and son; Hypertension in her  brother and brother; Miscarriages / [place] in her daughter; Stroke in her brother.    ROS:  Please see the history of present illness.   Otherwise, review of systems are positive for none.   All other systems are reviewed and negative.    PHYSICAL EXAM: VS:  BP 132/80 (BP Location: Right Arm, Patient Position: Sitting, Cuff Size: Normal)   Pulse 83   Ht 5\' 4"  (1.626 m)   Wt 154 lb (69.9 kg)   BMI 26.43 kg/m  , BMI Body mass index is 26.43 kg/m. GEN: Well nourished, well developed, in no acute distress  HEENT: normal  Neck: no JVD, carotid bruits, or masses Cardiac: RRR; no murmurs, rubs, or gallops,no edema  Respiratory:  clear to auscultation bilaterally, normal work of breathing GI: soft, nontender, nondistended, + BS MS: no deformity or atrophy  Skin: warm and dry, no rash Neuro:  Strength and sensation are intact Psych: euthymic mood, full affect   EKG:  EKG is ordered today. The ekg ordered today demonstrates normal sinus rhythm with no significant ST or T wave changes.  No evidence of prior infarct.   Recent Labs: 11/11/2017: ALT 18; BUN 20; Creatinine, Ser 0.96; Hemoglobin 13.2; Platelets 202.0; Potassium 3.8; Sodium 141; TSH 1.45    Lipid Panel    Component Value Date/Time   CHOL 148 11/11/2017 0858   TRIG 72.0 11/11/2017 0858   HDL 66.70 11/11/2017 0858   CHOLHDL 2 11/11/2017 0858   VLDL 14.4 11/11/2017 0858   LDLCALC 67 11/11/2017 0858   LDLDIRECT 134.4 11/13/2008 0938      Wt Readings from Last 3 Encounters:  10/17/18 154 lb (69.9 kg)  03/10/18 152 lb (68.9 kg)  11/03/17 149 lb (67.6 kg)       PAD Screen 10/17/2018  Previous PAD dx? No  Previous surgical procedure? No  Pain with walking? No  Feet/toe relief with dangling? No  Painful, non-healing ulcers? No  Extremities discolored? No      ASSESSMENT AND PLAN:  1.  Atherosclerotic cardiovascular disease assessment: In spite of strong family history, her risk factors are well  controlled overall and she follows a relatively healthy lifestyle.  She exercises regularly.  She currently has no exertional symptoms to suggest angina.  Her cardiac exam is unremarkable and baseline EKG is normal.  She does take low-dose aspirin and high-dose statin.  I explained to her that it seems that she is already doing all the right things to decrease her cardiovascular risk.  I do not think there is an indication for stress testing be given that she is asymptomatic.  I did discuss with her the option of possibly proceeding with coronary calcium score to evaluate plaque burden.  However, I doubt that this is going to change her management given that she is already taking low-dose aspirin and high-dose statin. She will continue with what she is doing for now and can follow-up with me if she becomes symptomatic.  2.  Essential hypertension: Blood pressure is controlled here but seems to be high at home.  I suggested that she takes her blood pressure machine to her follow-up appointment with her primary physician to see if it correlates.  3.  Hyperlipidemia: Currently on high-dose rosuvastatin.  Lipid profile used to be uncontrolled but most recent LDL was 67.    Disposition:   FU with me as needed  Signed,  Kathlyn Sacramento, MD  10/17/2018 3:23 PM    [place]

## 2018-10-20 ENCOUNTER — Other Ambulatory Visit: Payer: Self-pay

## 2018-10-20 ENCOUNTER — Ambulatory Visit (INDEPENDENT_AMBULATORY_CARE_PROVIDER_SITE_OTHER): Payer: Medicare Other

## 2018-10-20 DIAGNOSIS — Z0000 Encounter for general adult medical examination without abnormal findings: Secondary | ICD-10-CM

## 2018-10-20 NOTE — Progress Notes (Signed)
Subjective:   Patricia Curtis is a 73 y.o. female who presents for an Initial Medicare Annual Wellness Visit.  Review of Systems    No ROS.  Medicare Wellness Virtual Visit.  Visual/audio telehealth visit, UTA vital signs.   See social history for additional risk factors.    Cardiac Risk Factors include: advanced age (>59mn, >>40women);hypertension     Objective:    Today's Vitals   There is no height or weight on file to calculate BMI.  Advanced Directives 10/20/2018 02/07/2017  Does Patient Have a Medical Advance Directive? No No  Would patient like information on creating a medical advance directive? Yes (MAU/Ambulatory/Procedural Areas - Information given) No - Patient declined    Current Medications (verified) Outpatient Encounter Medications as of 10/20/2018  Medication Sig  . Ascorbic Acid (VITAMIN C) 1000 MG tablet Take 1,000 mg by mouth daily.  .Marland Kitchenaspirin EC 81 MG tablet Take by mouth daily.   . baclofen (LIORESAL) 10 MG tablet Take 1 tablet (10 mg total) by mouth at bedtime as needed for muscle spasms.  . calcium carbonate (OSCAL) 1500 (600 Ca) MG TABS tablet Take 600 mg of elemental calcium by mouth 2 (two) times daily with a meal.  . Cholecalciferol (VITAMIN D-3) 125 MCG (5000 UT) TABS Take by mouth.  . cycloSPORINE (RESTASIS) 0.05 % ophthalmic emulsion 1 drop 2 (two) times daily.  . DULoxetine (CYMBALTA) 30 MG capsule Take 1 capsule (30 mg total) by mouth daily.  .Marland Kitchenestradiol (ESTRACE) 0.1 MG/GM vaginal cream Place 1 Applicatorful vaginally at bedtime. 1-3x per week as needed  . furosemide (LASIX) 20 MG tablet Take 0.5 tablets (10 mg total) by mouth daily as needed. Prn  . losartan (COZAAR) 50 MG tablet Take 1 tablet (50 mg total) by mouth daily.  . Melatonin 3 MG TABS Take by mouth daily.   . rosuvastatin (CRESTOR) 40 MG tablet Take 1 tablet (40 mg total) by mouth daily.  . traMADol (ULTRAM) 50 MG tablet Take 1 tablet (50 mg total) by mouth every 12 (twelve) hours as  needed.  . valACYclovir (VALTREX) 1000 MG tablet Take 1 tablet (1,000 mg total) by mouth 2 (two) times daily. X 7-10 days outbreak   No facility-administered encounter medications on file as of 10/20/2018.     Allergies (verified) Lisinopril   History: Past Medical History:  Diagnosis Date  . Arthritis   . Family history of heart attack   . Herpes   . Hyperlipidemia   . Hypertension   . Kidney disease    not specified; [facility] 04/20[facility]16   . Prediabetes   . Syncope    2/2 neck and back pain in the past  . Vitamin D deficiency    Past Surgical History:  Procedure Laterality Date  . ABDOMINAL HYSTERECTOMY     age 73y.o ? reason per pt DUB no h/o abnormal pap ovaries intact   . APPENDECTOMY     1963  . BREAST CYST ASPIRATION Right 1994  . BREAST SURGERY     bx right breast 2001 benign    Family History  Problem Relation Age of Onset  . Heart disease Mother        MI  . Cancer Father        prostate to bone  . Early death Brother   . Heart disease Brother        MI  . Stroke Brother   . Miscarriages / [place]   . Hyperlipidemia  Son   . Heart disease Maternal Grandfather   . Cancer Brother        multiple myeloma  . Heart disease Brother        MI  . Hyperlipidemia Brother   . Hypertension Brother   . Hypertension Brother   . Hyperlipidemia Brother   . Heart disease Brother        MI  . Cancer Brother        prostate   . Breast cancer Neg Hx    Social History   Socioeconomic History  . Marital status: Married    Spouse name: Not on file  . Number of children: Not on file  . Years of education: Not on file  . Highest education level: Not on file  Occupational History  . Not on file  Social Needs  . Financial resource strain: Not hard at all  . Food insecurity    Worry: Never true    Inability: Never true  . Transportation needs    Medical: No    Non-medical: No  Tobacco Use  . Smoking status: Never Smoker  . Smokeless  tobacco: Never Used  Substance and Sexual Activity  . Alcohol use: Not Currently  . Drug use: Not Currently  . Sexual activity: Not Currently    Partners: Male  Lifestyle  . Physical activity    Days per week: 7 days    Minutes per session: 30 min  . Stress: Not at all  Relationships  . Social Herbalist on phone: Not on file    Gets together: Not on file    Attends religious service: Not on file    Active member of club or organization: Not on file    Attends meetings of clubs or organizations: Not on file    Relationship status: Not on file  Other Topics Concern  . Not on file  Social History Narrative   Married    2 kids son and daughter    HS ed    Press photographer, retired    No guns    Wears seat belt    Safe in relationship     Tobacco Counseling Counseling given: Not Answered   Clinical Intake:  Pre-visit preparation completed: Yes        Diabetes: No  How often do you need to have someone help you when you read instructions, pamphlets, or other written materials from your doctor or pharmacy?: 1 - Never  Interpreter Needed?: No      Activities of Daily Living In your present state of health, do you have any difficulty performing the following activities: 10/20/2018  Hearing? N  Vision? N  Difficulty concentrating or making decisions? N  Walking or climbing stairs? N  Dressing or bathing? N  Doing errands, shopping? N  Preparing Food and eating ? N  Using the Toilet? N  In the past six months, have you accidently leaked urine? N  Do you have problems with loss of bowel control? N  Managing your Medications? N  Managing your Finances? N  Housekeeping or managing your Housekeeping? N  Some recent data might be hidden     Immunizations and Health Maintenance Immunization History  Administered Date(s) Administered  . Influenza Whole 12/02/2008  . Influenza, High Dose Seasonal PF 11/03/2017  . Influenza-Unspecified 11/20/2016  . Td  01/02/2009  . Zoster Recombinat (Shingrix) 01/08/2017, 06/23/2017   There are no preventive care reminders to display for this patient.  Patient Care Team: McLean-Scocuzza, Nino Glow, MD as PCP - General (Internal Medicine)  Indicate any recent Medical Services you may have received from other than [facility] providers in the past year (date may be approximate).     Assessment:   This is a routine wellness examination for Ahriyah.  I connected with patient 10/20/18 at 12:00 PM EDT by an audio enabled telemedicine application and verified that I am speaking with the correct person using two identifiers. Patient stated full name and DOB. Patient gave permission to continue with virtual visit. Patient's location was at home and Nurse's location was at [place] office.   Health Maintenance Due: -Influenza vaccine 2020- discussed; to be completed in season with doctor or local pharmacy.   -PNA - discussed; to be completed with doctor in visit or local pharmacy.  Update all pending maintenance due as appropriate.   See completed HM at the end of note.   Eye: Visual acuity not assessed. Virtual visit. Wears corrective lenses. Followed by their ophthalmologist every 12 months.   Dental: Visits every 12 months.    Hearing: Demonstrates normal hearing during visit.  Safety:  Patient feels safe at home- yes Patient does have smoke detectors at home- yes Patient does wear sunscreen or protective clothing when in direct sunlight - yes Patient does wear seat belt when in a moving vehicle - yes Patient drives- yes Adequate lighting in walkways free from debris- yes Grab bars and handrails used as appropriate- yes Ambulates with no assistive device Cell phone on person when ambulating outside of the home- yes  Social: Alcohol intake - no    Smoking history- never   Smokers in home? none Illicit drug use? none  Depression: PHQ 2 &9 complete. See screening below. Denies irritability, anhedonia,  sadness/tearfullness.  Stable.   Falls: See screening below.    Medication: Taking as directed and without issues.   Covid-19: Precautions and sickness symptoms discussed. Wears mask, social distancing, hand hygiene as appropriate.   Activities of Daily Living Patient denies needing assistance with: household chores, feeding themselves, getting from bed to chair, getting to the toilet, bathing/showering, dressing, managing money, or preparing meals.   Memory: Patient is alert. Patient denies difficulty focusing or concentrating. Correctly identified the president of the [place], season and recall.  BMI- discussed the importance of a healthy diet, water intake and the benefits of aerobic exercise.  Educational material provided.  Physical activity- walking 1.5 miles daily  Diet: Low carb Water: good intake Caffeine: 3-4 cup coffee  Advanced Directive: End of life planning; Advance aging; Advanced directives discussed.  Copy of current HCPOA/Living Will requested.    Other Providers Patient Care Team: McLean-Scocuzza, Nino Glow, MD as PCP - General (Internal Medicine)  Hearing/Vision screen  Hearing Screening   '125Hz'  '250Hz'  '500Hz'  '1000Hz'  '2000Hz'  '3000Hz'  '4000Hz'  '6000Hz'  '8000Hz'   Right ear:           Left ear:           Comments: Patient is able to hear conversational tones without difficulty.  No issues reported.   Vision Screening Comments: Wears corrective lenses Visual acuity not assessed, virtual visit.  They have seen their ophthalmologist in the last 12 months.     Dietary issues and exercise activities discussed: Current Exercise Habits: Home exercise routine, Type of exercise: walking, Time (Minutes): 30, Frequency (Times/Week): 7, Weekly Exercise (Minutes/Week): 210, Intensity: Moderate  Goals      Patient Stated   . Increase physical activity (pt-stated)  Walk 3 miles daily      Other   . Follow up with Primary Care Provider     As needed      Depression  Screen PHQ 2/9 Scores 10/20/2018 07/11/2018 03/10/2018 11/03/2017  PHQ - 2 Score 0 0 0 0    Fall Risk Fall Risk  10/20/2018 07/11/2018 03/10/2018 11/03/2017  Falls in the past year? 0 0 0 No   Timed Get Up and Go Performed no, virtual visit  Cognitive Function:     6CIT Screen 10/20/2018  What Year? 0 points  What month? 0 points  What time? 0 points  Count back from 20 0 points  Months in reverse 0 points  Repeat phrase 0 points  Total Score 0    Screening Tests Health Maintenance  Topic Date Due  . INFLUENZA VACCINE  05/02/2019 (Originally 09/02/2018)  . PNA vac Low Risk Adult (1 of 2 - PCV13) 10/20/2019 (Originally 02/24/2012)  . TETANUS/TDAP  01/03/2019  . MAMMOGRAM  12/03/2019  . COLONOSCOPY  06/26/2025  . DEXA SCAN  Completed  . Hepatitis C Screening  Completed     Plan:   Keep all routine maintenance appointments.   Next scheduled lab 11/13/18 @ 8:00  Follow up 11/22/18 @ 11:00  Medicare Attestation I have personally reviewed: The patient's medical and social history Their use of alcohol, tobacco or illicit drugs Their current medications and supplements The patient's functional ability including ADLs,fall risks, home safety risks, cognitive, and hearing and visual impairment Diet and physical activities Evidence for depression   In addition, I have reviewed and discussed with patient certain preventive protocols, quality metrics, and best practice recommendations. A written personalized care plan for preventive services as well as general preventive health recommendations were provided to patient via mail.     Varney Biles, LPN   04/20/377

## 2018-10-20 NOTE — Patient Instructions (Addendum)
  Patricia Curtis , Thank you for taking time to come for your Medicare Wellness Visit. I appreciate your ongoing commitment to your health goals. Please review the following plan we discussed and let me know if I can assist you in the future.   These are the goals we discussed: Goals      Patient Stated   . Increase physical activity (pt-stated)     Walk 3 miles daily      Other   . Follow up with Primary Care Provider     As needed       This is a list of the screening recommended for you and due dates:  Health Maintenance  Topic Date Due  . Flu Shot  05/02/2019*  . Pneumonia vaccines (1 of 2 - PCV13) 10/20/2019*  . Tetanus Vaccine  01/03/2019  . Mammogram  12/03/2019  . Colon Cancer Screening  06/26/2025  . DEXA scan (bone density measurement)  Completed  .  Hepatitis C: One time screening is recommended by Center for Disease Control  (CDC) for  adults born from [birth year] through 1963.   Completed  *Topic was postponed. The date shown is not the original due date.

## 2018-10-28 DIAGNOSIS — Z23 Encounter for immunization: Secondary | ICD-10-CM | POA: Diagnosis not present

## 2018-11-10 ENCOUNTER — Ambulatory Visit: Payer: Medicare Other

## 2018-11-13 ENCOUNTER — Other Ambulatory Visit (INDEPENDENT_AMBULATORY_CARE_PROVIDER_SITE_OTHER): Payer: Medicare Other

## 2018-11-13 ENCOUNTER — Telehealth: Payer: Self-pay

## 2018-11-13 ENCOUNTER — Other Ambulatory Visit: Payer: Self-pay

## 2018-11-13 ENCOUNTER — Other Ambulatory Visit: Payer: Medicare Other

## 2018-11-13 DIAGNOSIS — I10 Essential (primary) hypertension: Secondary | ICD-10-CM

## 2018-11-13 DIAGNOSIS — R7303 Prediabetes: Secondary | ICD-10-CM | POA: Diagnosis not present

## 2018-11-13 DIAGNOSIS — Z1329 Encounter for screening for other suspected endocrine disorder: Secondary | ICD-10-CM

## 2018-11-13 DIAGNOSIS — E785 Hyperlipidemia, unspecified: Secondary | ICD-10-CM | POA: Diagnosis not present

## 2018-11-13 DIAGNOSIS — E559 Vitamin D deficiency, unspecified: Secondary | ICD-10-CM

## 2018-11-13 LAB — COMPREHENSIVE METABOLIC PANEL
ALT: 22 U/L (ref 0–35)
AST: 23 U/L (ref 0–37)
Albumin: 4.3 g/dL (ref 3.5–5.2)
Alkaline Phosphatase: 53 U/L (ref 39–117)
BUN: 22 mg/dL (ref 6–23)
CO2: 29 mEq/L (ref 19–32)
Calcium: 10.1 mg/dL (ref 8.4–10.5)
Chloride: 101 mEq/L (ref 96–112)
Creatinine, Ser: 0.97 mg/dL (ref 0.40–1.20)
GFR: 56.5 mL/min — ABNORMAL LOW (ref >60.00)
Glucose, Bld: 111 mg/dL — ABNORMAL HIGH (ref 70–99)
Potassium: 4.2 mEq/L (ref 3.5–5.1)
Sodium: 139 mEq/L (ref 135–145)
Total Bilirubin: 0.5 mg/dL (ref 0.2–1.2)
Total Protein: 7.1 g/dL (ref 6.0–8.3)

## 2018-11-13 LAB — CBC WITH DIFFERENTIAL/PLATELET
Basophils Absolute: 0.1 10*3/uL (ref 0.0–0.1)
Basophils Relative: 1.1 % (ref 0.0–3.0)
Eosinophils Absolute: 0.1 10*3/uL (ref 0.0–0.7)
Eosinophils Relative: 2.7 % (ref 0.0–5.0)
HCT: 42.1 % (ref 36.0–46.0)
Hemoglobin: 13.8 g/dL (ref 12.0–15.0)
Lymphocytes Relative: 36.3 % (ref 12.0–46.0)
Lymphs Abs: 1.9 10*3/uL (ref 0.7–4.0)
MCHC: 32.7 g/dL (ref 30.0–36.0)
MCV: 86.9 fl (ref 78.0–100.0)
Monocytes Absolute: 0.5 10*3/uL (ref 0.1–1.0)
Monocytes Relative: 8.4 % (ref 3.0–12.0)
Neutro Abs: 2.8 10*3/uL (ref 1.4–7.7)
Neutrophils Relative %: 51.5 % (ref 43.0–77.0)
Platelets: 218 10*3/uL (ref 150.0–400.0)
RBC: 4.85 Mil/uL (ref 3.87–5.11)
RDW: 13.6 % (ref 11.5–15.5)
WBC: 5.4 10*3/uL (ref 4.0–10.5)

## 2018-11-13 LAB — VITAMIN D 25 HYDROXY (VIT D DEFICIENCY, FRACTURES): VITD: 56.45 ng/mL (ref 30.00–100.00)

## 2018-11-13 LAB — LIPID PANEL
Cholesterol: 171 mg/dL (ref 0–200)
HDL: 71.4 mg/dL (ref >39.00)
LDL Cholesterol: 80 mg/dL (ref 0–99)
NonHDL: 99.86
Total CHOL/HDL Ratio: 2
Triglycerides: 97 mg/dL (ref 0.0–149.0)
VLDL: 19.4 mg/dL (ref 0.0–40.0)

## 2018-11-13 LAB — HEMOGLOBIN A1C: Hgb A1c MFr Bld: 6.7 % — ABNORMAL HIGH (ref 4.6–6.5)

## 2018-11-13 LAB — TSH: TSH: 3.4 u[IU]/mL (ref 0.35–4.50)

## 2018-11-13 NOTE — Addendum Note (Signed)
Addended by: Leeanne Rio on: 11/13/2018 08:44 AM   Modules accepted: Orders

## 2018-11-13 NOTE — Addendum Note (Signed)
Addended by: Leeanne Rio on: 11/13/2018 08:04 AM   Modules accepted: Orders

## 2018-11-13 NOTE — Telephone Encounter (Signed)
Noted, thanks!

## 2018-11-13 NOTE — Addendum Note (Signed)
Addended by: Leeanne Rio on: 11/13/2018 03:38 PM   Modules accepted: Orders

## 2018-11-13 NOTE — Telephone Encounter (Signed)
Copied from [place] [id number]. Topic: General - Inquiry >> Nov 13, 2018  8:27 AM Scherrie Gerlach wrote: Reason for CRM: pt could not give UA this am at her lab appt.  She is coming back this afternoon at 3:15.  She wants you to know she put empty cup on shelf and did not tell anyone

## 2018-11-14 LAB — URINALYSIS, ROUTINE W REFLEX MICROSCOPIC
Bacteria, UA: NONE SEEN /HPF
Bilirubin Urine: NEGATIVE
Glucose, UA: NEGATIVE
Hgb urine dipstick: NEGATIVE
Hyaline Cast: NONE SEEN /LPF
Ketones, ur: NEGATIVE
Nitrite: NEGATIVE
Protein, ur: NEGATIVE
RBC / HPF: NONE SEEN /HPF (ref 0–2)
Specific Gravity, Urine: 1.012 (ref 1.001–1.03)
pH: 6 (ref 5.0–8.0)

## 2018-11-15 ENCOUNTER — Encounter: Payer: Self-pay | Admitting: Internal Medicine

## 2018-11-20 ENCOUNTER — Other Ambulatory Visit: Payer: Self-pay

## 2018-11-22 ENCOUNTER — Encounter: Payer: Self-pay | Admitting: Internal Medicine

## 2018-11-22 ENCOUNTER — Other Ambulatory Visit: Payer: Self-pay

## 2018-11-22 ENCOUNTER — Ambulatory Visit (INDEPENDENT_AMBULATORY_CARE_PROVIDER_SITE_OTHER): Payer: Medicare Other | Admitting: Internal Medicine

## 2018-11-22 VITALS — BP 134/70 | HR 67 | Temp 97.6°F | Ht 64.0 in | Wt 153.6 lb

## 2018-11-22 DIAGNOSIS — N952 Postmenopausal atrophic vaginitis: Secondary | ICD-10-CM | POA: Diagnosis not present

## 2018-11-22 DIAGNOSIS — Z1283 Encounter for screening for malignant neoplasm of skin: Secondary | ICD-10-CM

## 2018-11-22 DIAGNOSIS — M545 Low back pain, unspecified: Secondary | ICD-10-CM

## 2018-11-22 DIAGNOSIS — M542 Cervicalgia: Secondary | ICD-10-CM

## 2018-11-22 DIAGNOSIS — G8929 Other chronic pain: Secondary | ICD-10-CM | POA: Diagnosis not present

## 2018-11-22 DIAGNOSIS — I10 Essential (primary) hypertension: Secondary | ICD-10-CM

## 2018-11-22 DIAGNOSIS — L821 Other seborrheic keratosis: Secondary | ICD-10-CM | POA: Diagnosis not present

## 2018-11-22 MED ORDER — FUROSEMIDE 20 MG PO TABS: 10.0000 mg | ORAL_TABLET | Freq: Every day | ORAL | 11 refills | Status: DC | PRN

## 2018-11-22 MED ORDER — TRAMADOL HCL 50 MG PO TABS: 50.0000 mg | ORAL_TABLET | Freq: Two times a day (BID) | ORAL | 2 refills | Status: DC | PRN

## 2018-11-22 MED ORDER — INTRAROSA 6.5 MG VA INST: 1.0000 | VAGINAL_INSERT | Freq: Every day | VAGINAL | 11 refills | Status: DC

## 2018-11-22 NOTE — Progress Notes (Signed)
Chief Complaint  Patient presents with  . Follow-up   F/u doing well  1. Chronic pain I.e neck and back needs refill of tramadol  2. Vaginal atrophy on estrace not effective will try intrarosa  3. DM 2 6.7 A1C reviewed labs was not complaint with diet went prediabetes to DM 2 h/o long term prediabetes declines meds and nutrition for now    Review of Systems  Constitutional: Negative for weight loss.  HENT: Negative for hearing loss.   Eyes: Negative for blurred vision.  Respiratory: Negative for shortness of breath.   Cardiovascular: Negative for chest pain.  Gastrointestinal: Negative for abdominal pain.  Genitourinary:       +vaginal dryness   Musculoskeletal: Negative for back pain and neck pain.  Skin: Negative for rash.  Neurological: Negative for headaches.  Psychiatric/Behavioral: Negative for depression.   Past Medical History:  Diagnosis Date  . Arthritis   . Family history of heart attack   . Herpes   . Hyperlipidemia   . Hypertension   . Kidney disease    not specified; [facility] 04/20/16   . Prediabetes   . Syncope    2/2 neck and back pain in the past  . Vitamin D deficiency    Past Surgical History:  Procedure Laterality Date  . ABDOMINAL HYSTERECTOMY     age 73 y.o ? reason per pt DUB no h/o abnormal pap ovaries intact   . APPENDECTOMY     1963  . BREAST CYST ASPIRATION Right 1994  . BREAST SURGERY     bx right breast 2001 benign    Family History  Problem Relation Age of Onset  . Heart disease Mother        MI  . Cancer Father        prostate to bone  . Early death Brother   . Heart disease Brother        MI  . Stroke Brother   . Miscarriages / [place] Daughter   . Hyperlipidemia Son   . Heart disease Maternal Grandfather   . Cancer Brother        multiple myeloma  . Heart disease Brother        MI  . Hyperlipidemia Brother   . Hypertension Brother   . Hypertension Brother   . Hyperlipidemia Brother   . Heart  disease Brother        MI  . Cancer Brother        prostate   . Breast cancer Neg Hx    Social History   Socioeconomic History  . Marital status: Married    Spouse name: Not on file  . Number of children: Not on file  . Years of education: Not on file  . Highest education level: Not on file  Occupational History  . Not on file  Social Needs  . Financial resource strain: Not hard at all  . Food insecurity    Worry: Never true    Inability: Never true  . Transportation needs    Medical: No    Non-medical: No  Tobacco Use  . Smoking status: Never Smoker  . Smokeless tobacco: Never Used  Substance and Sexual Activity  . Alcohol use: Not Currently  . Drug use: Not Currently  . Sexual activity: Not Currently    Partners: Male  Lifestyle  . Physical activity    Days per week: 7 days    Minutes per session: 30 min  . Stress: Not at  all  Relationships  . Social Herbalist on phone: Not on file    Gets together: Not on file    Attends religious service: Not on file    Active member of club or organization: Not on file    Attends meetings of clubs or organizations: Not on file    Relationship status: Not on file  . Intimate partner violence    Fear of current or ex partner: No    Emotionally abused: No    Physically abused: No    Forced sexual activity: No  Other Topics Concern  . Not on file  Social History Narrative   Married    2 kids son and daughter    HS ed    Press photographer, retired    No guns    Wears seat belt    Safe in relationship    Current Meds  Medication Sig  . Ascorbic Acid (VITAMIN C) 1000 MG tablet Take 1,000 mg by mouth daily.  Marland Kitchen aspirin EC 81 MG tablet Take by mouth daily.   . baclofen (LIORESAL) 10 MG tablet Take 1 tablet (10 mg total) by mouth at bedtime as needed for muscle spasms.  . calcium carbonate (OSCAL) 1500 (600 Ca) MG TABS tablet Take 600 mg of elemental calcium by mouth 2 (two) times daily with a meal.  . Cholecalciferol  (VITAMIN D-3) 125 MCG (5000 UT) TABS Take by mouth.  . cycloSPORINE (RESTASIS) 0.05 % ophthalmic emulsion 1 drop 2 (two) times daily.  . DULoxetine (CYMBALTA) 30 MG capsule Take 1 capsule (30 mg total) by mouth daily.  . furosemide (LASIX) 20 MG tablet Take 0.5 tablets (10 mg total) by mouth daily as needed. Prn  . losartan (COZAAR) 50 MG tablet Take 1 tablet (50 mg total) by mouth daily.  . Melatonin 3 MG TABS Take by mouth daily.   . rosuvastatin (CRESTOR) 40 MG tablet Take 1 tablet (40 mg total) by mouth daily.  . traMADol (ULTRAM) 50 MG tablet Take 1 tablet (50 mg total) by mouth every 12 (twelve) hours as needed.  . valACYclovir (VALTREX) 1000 MG tablet Take 1 tablet (1,000 mg total) by mouth 2 (two) times daily. X 7-10 days outbreak  . [DISCONTINUED] estradiol (ESTRACE) 0.1 MG/GM vaginal cream Place 1 Applicatorful vaginally at bedtime. 1-3x per week as needed  . [DISCONTINUED] furosemide (LASIX) 20 MG tablet Take 0.5 tablets (10 mg total) by mouth daily as needed. Prn  . [DISCONTINUED] traMADol (ULTRAM) 50 MG tablet Take 1 tablet (50 mg total) by mouth every 12 (twelve) hours as needed.   Allergies  Allergen Reactions  . Lisinopril     Cough    Recent Results (from the past 2160 hour(s))  Vitamin D (25 hydroxy)     Status: None   Collection Time: 11/13/18  8:03 AM  Result Value Ref Range   VITD 56.45 30.00 - 100.00 ng/mL  TSH     Status: None   Collection Time: 11/13/18  8:03 AM  Result Value Ref Range   TSH 3.40 0.35 - 4.50 uIU/mL  Hemoglobin A1c     Status: Abnormal   Collection Time: 11/13/18  8:03 AM  Result Value Ref Range   Hgb A1c MFr Bld 6.7 (H) 4.6 - 6.5 %    Comment: Glycemic Control Guidelines for People with Diabetes:Non Diabetic:  <6%Goal of Therapy: <7%Additional Action Suggested:  >8%   CBC w/Diff     Status: None   Collection Time: 11/13/18  8:03 AM  Result Value Ref Range   WBC 5.4 4.0 - 10.5 K/uL   RBC 4.85 3.87 - 5.11 Mil/uL   Hemoglobin 13.8 12.0 -  15.0 g/dL   HCT 42.1 36.0 - 46.0 %   MCV 86.9 78.0 - 100.0 fl   MCHC 32.7 30.0 - 36.0 g/dL   RDW 13.6 11.5 - 15.5 %   Platelets 218.0 150.0 - 400.0 K/uL   Neutrophils Relative % 51.5 43.0 - 77.0 %   Lymphocytes Relative 36.3 12.0 - 46.0 %   Monocytes Relative 8.4 3.0 - 12.0 %   Eosinophils Relative 2.7 0.0 - 5.0 %   Basophils Relative 1.1 0.0 - 3.0 %   Neutro Abs 2.8 1.4 - 7.7 K/uL   Lymphs Abs 1.9 0.7 - 4.0 K/uL   Monocytes Absolute 0.5 0.1 - 1.0 K/uL   Eosinophils Absolute 0.1 0.0 - 0.7 K/uL   Basophils Absolute 0.1 0.0 - 0.1 K/uL  Lipid panel     Status: None   Collection Time: 11/13/18  8:03 AM  Result Value Ref Range   Cholesterol 171 0 - 200 mg/dL    Comment: ATP III Classification       Desirable:  < 200 mg/dL               Borderline High:  200 - 239 mg/dL          High:  > = 240 mg/dL   Triglycerides 97.0 0.0 - 149.0 mg/dL    Comment: Normal:  <150 mg/dLBorderline High:  150 - 199 mg/dL   HDL 71.40 >39.00 mg/dL   VLDL 19.4 0.0 - 40.0 mg/dL   LDL Cholesterol 80 0 - 99 mg/dL   Total CHOL/HDL Ratio 2     Comment:                Men          Women1/2 Average Risk     3.4          3.3Average Risk          5.0          4.42X Average Risk          9.6          7.13X Average Risk          15.0          11.0                       NonHDL 99.86     Comment: NOTE:  Non-HDL goal should be 30 mg/dL higher than patient's LDL goal (i.e. LDL goal of < 70 mg/dL, would have non-HDL goal of < 100 mg/dL)  Comprehensive metabolic panel     Status: Abnormal   Collection Time: 11/13/18  8:03 AM  Result Value Ref Range   Sodium 139 135 - 145 mEq/L   Potassium 4.2 3.5 - 5.1 mEq/L   Chloride 101 96 - 112 mEq/L   CO2 29 19 - 32 mEq/L   Glucose, Bld 111 (H) 70 - 99 mg/dL   BUN 22 6 - 23 mg/dL   Creatinine, Ser 0.97 0.40 - 1.20 mg/dL   Total Bilirubin 0.5 0.2 - 1.2 mg/dL   Alkaline Phosphatase 53 39 - 117 U/L   AST 23 0 - 37 U/L   ALT 22 0 - 35 U/L   Total Protein 7.1 6.0 - 8.3 g/dL   Albumin  4.3 3.5 -  5.2 g/dL   Calcium 10.1 8.4 - 10.5 mg/dL   GFR 56.50 (L) >60.00 mL/min  Urinalysis, Routine w reflex microscopic     Status: Abnormal   Collection Time: 11/13/18  3:23 PM  Result Value Ref Range   Color, Urine YELLOW YELLOW   APPearance CLEAR CLEAR   Specific Gravity, Urine 1.012 1.001 - 1.03   pH 6.0 5.0 - 8.0   Glucose, UA NEGATIVE NEGATIVE   Bilirubin Urine NEGATIVE NEGATIVE   Ketones, ur NEGATIVE NEGATIVE   Hgb urine dipstick NEGATIVE NEGATIVE   Protein, ur NEGATIVE NEGATIVE   Nitrite NEGATIVE NEGATIVE   Leukocytes,Ua 3+ (A) NEGATIVE   WBC, UA 20-40 (A) 0 - 5 /HPF   RBC / HPF NONE SEEN 0 - 2 /HPF   Squamous Epithelial / LPF 0-5 < OR = 5 /HPF   Bacteria, UA NONE SEEN NONE SEEN /HPF   Hyaline Cast NONE SEEN NONE SEEN /LPF   Objective  Body mass index is 26.37 kg/m. Wt Readings from Last 3 Encounters:  11/22/18 153 lb 9.6 oz (69.7 kg)  10/17/18 154 lb (69.9 kg)  03/10/18 152 lb (68.9 kg)   Temp Readings from Last 3 Encounters:  11/22/18 97.6 F (36.4 C) (Oral)  03/10/18 98.3 F (36.8 C) (Oral)  11/03/17 98.5 F (36.9 C) (Oral)   BP Readings from Last 3 Encounters:  11/22/18 134/70  10/17/18 132/80  03/10/18 (!) 142/94   Pulse Readings from Last 3 Encounters:  11/22/18 67  10/17/18 83  03/10/18 71    Physical Exam Vitals signs and nursing note reviewed.  Constitutional:      Appearance: Normal appearance. She is well-developed and well-groomed.     Comments: +mask on    HENT:     Head: Normocephalic and atraumatic.  Eyes:     Conjunctiva/sclera: Conjunctivae normal.     Pupils: Pupils are equal, round, and reactive to light.  Cardiovascular:     Rate and Rhythm: Normal rate and regular rhythm.     Heart sounds: Normal heart sounds. No murmur.  Pulmonary:     Effort: Pulmonary effort is normal.     Breath sounds: Normal breath sounds.  Skin:    General: Skin is warm and dry.       Neurological:     General: No focal deficit present.      Mental Status: She is alert and oriented to person, place, and time. Mental status is at baseline.     Gait: Gait normal.  Psychiatric:        Attention and Perception: Attention and perception normal.        Mood and Affect: Mood and affect normal.        Speech: Speech normal.        Behavior: Behavior normal. Behavior is cooperative.        Thought Content: Thought content normal.        Cognition and Memory: Cognition and memory normal.        Judgment: Judgment normal.     Assessment  Plan  Vaginal atrophy - Plan: Prasterone (INTRAROSA) 6.5 MG INST  Essential hypertension overall controlled - Plan: furosemide (LASIX) 20 MG tablet, losartan 50 mg qd   Chronic low back pain, unspecified back pain laterality, unspecified whether sciatica present - Plan: traMADol (ULTRAM) 50 MG tablet Chronic neck pain - Plan: traMADol (ULTRAM) 50 MG tablet  Seborrheic keratoses - Plan: Ambulatory referral to Dermatology   DM 2 A1C 6.7  Will  try diet and exercise  Disc The next 56 days  HM Flu shot utd 9 /24/20 Consider Tdap12/03/2018 duegiven Rx today  shingrix had 01/07/17 and 06/23/17 No proof of prevnar or pna 23 discuss in future  MMR, hep B immune  Hep C neg  Mammogram 12/02/17 negsch 12/08/18 colonoscopy 06/27/15 [place] [facility] small polyp sessile tubular f/u in 3 yearsto 5 years Never smoker  S/p hysterectomy DUB ovaries intact no h/o abnormal pap DEXA sch 04/11/2018+osteopenia T score -2.1 rec calcium and vitamin D on 40K IU qd   [employer] [place] eye exam no DM retinopathy f/u in 1 year 06/01/2018  Provider: Dr. Olivia Mackie McLean-[facility]oc[facility]

## 2018-11-22 NOTE — Patient Instructions (Addendum)
IntraROSA  The next 56 days online nutrition  Prasterone vaginal insert What is this medicine? PRASTERONE (PRAS ter one), also known as DEHYDROEPIANDROSTERONE (DHEA) is used to treat females who experience painful sexual intercourse, a symptom of menopause that occurs due to changes in and around the vagina. This medicine may be used for other purposes; ask your health care provider or pharmacist if you have questions. COMMON BRAND NAME(S): INTRAROSA What should I tell my health care provider before I take this medicine? They need to know if you have any of these conditions:  cancer, such as breast, uterine, or other cancer  history of vaginal bleeding  an unusual or allergic reaction to prasterone, DHEA, other hormones, medicines, foods, dyes, or preservatives  pregnant or trying to get pregnant  breast-feeding How should I use this medicine? This medicine is for vaginal use only. Do not take by mouth. Follow the directions on the prescription label. Read package directions carefully before using. Wash hands before and after use. Use this medicine at bedtime. Do not use it more often than directed. Do not stop using except on your doctor's advice. Talk to your pediatrician regarding the use of this medicine in children. This medicine is not approved for use in children. Overdosage: If you think you have taken too much of this medicine contact a poison control center or emergency room at once. NOTE: This medicine is only for you. Do not share this medicine with others. What if I miss a dose? If you miss a dose, use it as soon as you can. If it is almost time for your next dose, use only that dose. Do not use double or extra  doses. What may interact with this medicine? Interactions are not expected. Do not use any other vaginal products without telling your doctor or health care professional. This list may not describe all possible interactions. Give your health care provider a list of  all the medicines, herbs, non-prescription drugs, or dietary supplements you use. Also tell them if you smoke, drink alcohol, or use illegal drugs. Some items may interact with your medicine. What should I watch for while using this medicine? Visit your doctor or health care professional for a regular check on your progress. This medicine may cause changes on a cervical Pap smear. You will receive regular pelvic exams. What side effects may I notice from receiving this medicine? Side effects that you should report to your doctor or health care professional as soon as possible:  allergic reactions like skin rash, itching or hives Side effects that usually do not require medical attention (report to your doctor or health care professional if they continue or are bothersome):  vaginal discharge This list may not describe all possible side effects. Call your doctor for medical advice about side effects. You may report side effects to FDA at 1-800-FDA-1088. Where should I keep my medicine? Keep out of the reach of children. Store at room temperature or in a refrigerator between 5 and 30 degrees C (41 and 86 degrees F). Throw away any unused medicine after the expiration date. NOTE: This sheet is a summary. It may not cover all possible information. If you have questions about this medicine, talk to your doctor, pharmacist, or health care provider.  2020 Elsevier/Gold Standard (2015-07-15 12:30:18)    Exercising to Lose Weight Exercise is structured, repetitive physical activity to improve fitness and health. Getting regular exercise is important for everyone. It is especially important if you are overweight. Being  overweight increases your risk of heart disease, stroke, diabetes, high blood pressure, and several types of cancer. Reducing your calorie intake and exercising can help you lose weight. Exercise is usually categorized as moderate or vigorous intensity. To lose weight, most people need to  do a certain amount of moderate-intensity or vigorous-intensity exercise each week. Moderate-intensity exercise  Moderate-intensity exercise is any activity that gets you moving enough to burn at least three times more energy (calories) than if you were sitting. Examples of moderate exercise include:  Walking a mile in 15 minutes.  Doing light yard work.  Biking at an easy pace. Most people should get at least 150 minutes (2 hours and 30 minutes) a week of moderate-intensity exercise to maintain their body weight. Vigorous-intensity exercise Vigorous-intensity exercise is any activity that gets you moving enough to burn at least six times more calories than if you were sitting. When you exercise at this intensity, you should be working hard enough that you are not able to carry on a conversation. Examples of vigorous exercise include:  Running.  Playing a team sport, such as football, basketball, and soccer.  Jumping rope. Most people should get at least 75 minutes (1 hour and 15 minutes) a week of vigorous-intensity exercise to maintain their body weight. How can exercise affect me? When you exercise enough to burn more calories than you eat, you lose weight. Exercise also reduces body fat and builds muscle. The more muscle you have, the more calories you burn. Exercise also:  Improves mood.  Reduces stress and tension.  Improves your overall fitness, flexibility, and endurance.  Increases bone strength. The amount of exercise you need to lose weight depends on:  Your age.  The type of exercise.  Any health conditions you have.  Your overall physical ability. Talk to your health care provider about how much exercise you need and what types of activities are safe for you. What actions can I take to lose weight? Nutrition   Make changes to your diet as told by your health care provider or diet and nutrition specialist (dietitian). This may include: ? Eating fewer  calories. ? Eating more protein. ? Eating less unhealthy fats. ? Eating a diet that includes fresh fruits and vegetables, whole grains, low-fat dairy products, and lean protein. ? Avoiding foods with added fat, salt, and sugar.  Drink plenty of water while you exercise to prevent dehydration or heat stroke. Activity  Choose an activity that you enjoy and set realistic goals. Your health care provider can help you make an exercise plan that works for you.  Exercise at a moderate or vigorous intensity most days of the week. ? The intensity of exercise may vary from person to person. You can tell how intense a workout is for you by paying attention to your breathing and heartbeat. Most people will notice their breathing and heartbeat get faster with more intense exercise.  Do resistance training twice each week, such as: ? Push-ups. ? Sit-ups. ? Lifting weights. ? Using resistance bands.  Getting short amounts of exercise can be just as helpful as long structured periods of exercise. If you have trouble finding time to exercise, try to include exercise in your daily routine. ? Get up, stretch, and walk around every 30 minutes throughout the day. ? Go for a walk during your lunch break. ? Park your car farther away from your destination. ? If you take public transportation, get off one stop early and walk the rest of  the way. ? Make phone calls while standing up and walking around. ? Take the stairs instead of elevators or escalators.  Wear comfortable clothes and shoes with good support.  Do not exercise so much that you hurt yourself, feel dizzy, or get very short of breath. Where to find more information  U.S. Department of Health and Human Services: [employer].at  Centers for Disease Control and Prevention (CDC): http://www.wolf.info/ Contact a health care provider:  Before starting a new exercise program.  If you have questions or concerns about your weight.  If you have a medical  problem that keeps you from exercising. Get help right away if you have any of the following while exercising:  Injury.  Dizziness.  Difficulty breathing or shortness of breath that does not go away when you stop exercising.  Chest pain.  Rapid heartbeat. Summary  Being overweight increases your risk of heart disease, stroke, diabetes, high blood pressure, and several types of cancer.  Losing weight happens when you burn more calories than you eat.  Reducing the amount of calories you eat in addition to getting regular moderate or vigorous exercise each week helps you lose weight. This information is not intended to replace advice given to you by your health care provider. Make sure you discuss any questions you have with your health care provider. Document Released: 02/20/2010 Document Revised: 01/31/2017 Document Reviewed: 01/31/2017 Elsevier Patient Education  2020 [employer].

## 2018-11-23 ENCOUNTER — Encounter: Payer: Self-pay | Admitting: Internal Medicine

## 2018-11-29 ENCOUNTER — Encounter: Payer: Self-pay | Admitting: Internal Medicine

## 2018-11-29 ENCOUNTER — Other Ambulatory Visit: Payer: Self-pay | Admitting: Internal Medicine

## 2018-11-29 DIAGNOSIS — N952 Postmenopausal atrophic vaginitis: Secondary | ICD-10-CM

## 2018-11-29 MED ORDER — ESTRADIOL 0.1 MG/GM VA CREA: 1.0000 | TOPICAL_CREAM | Freq: Every day | VAGINAL | 11 refills | Status: DC

## 2018-12-08 ENCOUNTER — Ambulatory Visit
Admission: RE | Admit: 2018-12-08 | Discharge: 2018-12-08 | Disposition: A | Discharge status: Home / Self Care | Payer: Medicare Other | Source: Ambulatory Visit | Attending: Internal Medicine | Admitting: Internal Medicine

## 2018-12-08 DIAGNOSIS — Z1231 Encounter for screening mammogram for malignant neoplasm of breast: Secondary | ICD-10-CM | POA: Diagnosis not present

## 2019-01-04 ENCOUNTER — Encounter: Payer: Self-pay | Admitting: Internal Medicine

## 2019-03-02 ENCOUNTER — Ambulatory Visit: Payer: Medicare Other | Admitting: Internal Medicine

## 2019-03-03 DIAGNOSIS — Z23 Encounter for immunization: Secondary | ICD-10-CM | POA: Diagnosis not present

## 2019-03-06 ENCOUNTER — Encounter: Payer: Self-pay | Admitting: Internal Medicine

## 2019-03-22 ENCOUNTER — Ambulatory Visit (INDEPENDENT_AMBULATORY_CARE_PROVIDER_SITE_OTHER): Payer: Medicare Other | Admitting: Internal Medicine

## 2019-03-22 ENCOUNTER — Encounter: Payer: Self-pay | Admitting: Internal Medicine

## 2019-03-22 ENCOUNTER — Other Ambulatory Visit: Payer: Self-pay

## 2019-03-22 VITALS — Ht 64.0 in | Wt 153.6 lb

## 2019-03-22 DIAGNOSIS — M545 Low back pain, unspecified: Secondary | ICD-10-CM

## 2019-03-22 DIAGNOSIS — L821 Other seborrheic keratosis: Secondary | ICD-10-CM

## 2019-03-22 DIAGNOSIS — M542 Cervicalgia: Secondary | ICD-10-CM | POA: Diagnosis not present

## 2019-03-22 DIAGNOSIS — G8929 Other chronic pain: Secondary | ICD-10-CM

## 2019-03-22 DIAGNOSIS — I10 Essential (primary) hypertension: Secondary | ICD-10-CM

## 2019-03-22 DIAGNOSIS — E119 Type 2 diabetes mellitus without complications: Secondary | ICD-10-CM

## 2019-03-22 DIAGNOSIS — N951 Menopausal and female climacteric states: Secondary | ICD-10-CM

## 2019-03-22 MED ORDER — BACLOFEN 10 MG PO TABS: 10.0000 mg | ORAL_TABLET | Freq: Every evening | ORAL | 3 refills | Status: DC | PRN

## 2019-03-22 MED ORDER — TRAMADOL HCL 50 MG PO TABS: 50.0000 mg | ORAL_TABLET | Freq: Two times a day (BID) | ORAL | 2 refills | Status: DC | PRN

## 2019-03-22 MED ORDER — LOSARTAN POTASSIUM 50 MG PO TABS: 50.0000 mg | ORAL_TABLET | Freq: Every day | ORAL | 3 refills | Status: DC

## 2019-03-22 NOTE — Patient Instructions (Addendum)
Low Back Sprain or Strain Rehab Ask your health care provider which exercises are safe for you. Do exercises exactly as told by your health care provider and adjust them as directed. It is normal to feel mild stretching, pulling, tightness, or discomfort as you do these exercises. Stop right away if you feel sudden pain or your pain gets worse. Do not begin these exercises until told by your health care provider. Stretching and range-of-motion exercises These exercises warm up your muscles and joints and improve the movement and flexibility of your back. These exercises also help to relieve pain, numbness, and tingling. Lumbar rotation  1. Lie on your back on a firm surface and bend your knees. 2. Straighten your arms out to your sides so each arm forms a 90-degree angle (right angle) with a side of your body. 3. Slowly move (rotate) both of your knees to one side of your body until you feel a stretch in your lower back (lumbar). Try not to let your shoulders lift off the floor. 4. Hold this position for __________ seconds. 5. Tense your abdominal muscles and slowly move your knees back to the starting position. 6. Repeat this exercise on the other side of your body. Repeat __________ times. Complete this exercise __________ times a day. Single knee to chest  1. Lie on your back on a firm surface with both legs straight. 2. Bend one of your knees. Use your hands to move your knee up toward your chest until you feel a gentle stretch in your lower back and buttock. ? Hold your leg in this position by holding on to the front of your knee. ? Keep your other leg as straight as possible. 3. Hold this position for __________ seconds. 4. Slowly return to the starting position. 5. Repeat with your other leg. Repeat __________ times. Complete this exercise __________ times a day. Prone extension on elbows  1. Lie on your abdomen on a firm surface (prone position). 2. Prop yourself up on your  elbows. 3. Use your arms to help lift your chest up until you feel a gentle stretch in your abdomen and your lower back. ? This will place some of your body weight on your elbows. If this is uncomfortable, try stacking pillows under your chest. ? Your hips should stay down, against the surface that you are lying on. Keep your hip and back muscles relaxed. 4. Hold this position for __________ seconds. 5. Slowly relax your upper body and return to the starting position. Repeat __________ times. Complete this exercise __________ times a day. Strengthening exercises These exercises build strength and endurance in your back. Endurance is the ability to use your muscles for a long time, even after they get tired. Pelvic tilt This exercise strengthens the muscles that lie deep in the abdomen. 1. Lie on your back on a firm surface. Bend your knees and keep your feet flat on the floor. 2. Tense your abdominal muscles. Tip your pelvis up toward the ceiling and flatten your lower back into the floor. ? To help with this exercise, you may place a small towel under your lower back and try to push your back into the towel. 3. Hold this position for __________ seconds. 4. Let your muscles relax completely before you repeat this exercise. Repeat __________ times. Complete this exercise __________ times a day. Alternating arm and leg raises  1. Get on your hands and knees on a firm surface. If you are on a hard floor, you   may want to use padding, such as an exercise mat, to cushion your knees. 2. Line up your arms and legs. Your hands should be directly below your shoulders, and your knees should be directly below your hips. 3. Lift your left leg behind you. At the same time, raise your right arm and straighten it in front of you. ? Do not lift your leg higher than your hip. ? Do not lift your arm higher than your shoulder. ? Keep your abdominal and back muscles tight. ? Keep your hips facing the  ground. ? Do not arch your back. ? Keep your balance carefully, and do not hold your breath. 4. Hold this position for __________ seconds. 5. Slowly return to the starting position. 6. Repeat with your right leg and your left arm. Repeat __________ times. Complete this exercise __________ times a day. Abdominal set with straight leg raise  1. Lie on your back on a firm surface. 2. Bend one of your knees and keep your other leg straight. 3. Tense your abdominal muscles and lift your straight leg up, 4-6 inches (10-15 cm) off the ground. 4. Keep your abdominal muscles tight and hold this position for __________ seconds. ? Do not hold your breath. ? Do not arch your back. Keep it flat against the ground. 5. Keep your abdominal muscles tense as you slowly lower your leg back to the starting position. 6. Repeat with your other leg. Repeat __________ times. Complete this exercise __________ times a day. Single leg lower with bent knees 1. Lie on your back on a firm surface. 2. Tense your abdominal muscles and lift your feet off the floor, one foot at a time, so your knees and hips are bent in 90-degree angles (right angles). ? Your knees should be over your hips and your lower legs should be parallel to the floor. 3. Keeping your abdominal muscles tense and your knee bent, slowly lower one of your legs so your toe touches the ground. 4. Lift your leg back up to return to the starting position. ? Do not hold your breath. ? Do not let your back arch. Keep your back flat against the ground. 5. Repeat with your other leg. Repeat __________ times. Complete this exercise __________ times a day. Posture and body mechanics Good posture and healthy body mechanics can help to relieve stress in your body's tissues and joints. Body mechanics refers to the movements and positions of your body while you do your daily activities. Posture is part of body mechanics. Good posture means:  Your spine is in its  natural S-curve position (neutral).  Your shoulders are pulled back slightly.  Your head is not tipped forward. Follow these guidelines to improve your posture and body mechanics in your everyday activities. Standing   When standing, keep your spine neutral and your feet about hip width apart. Keep a slight bend in your knees. Your ears, shoulders, and hips should line up.  When you do a task in which you stand in one place for a long time, place one foot up on a stable object that is 2-4 inches (5-10 cm) high, such as a footstool. This helps keep your spine neutral. Sitting   When sitting, keep your spine neutral and keep your feet flat on the floor. Use a footrest, if necessary, and keep your thighs parallel to the floor. Avoid rounding your shoulders, and avoid tilting your head forward.  When working at a desk or a computer, keep your desk   at a height where your hands are slightly lower than your elbows. Slide your chair under your desk so you are close enough to maintain good posture.  When working at a computer, place your monitor at a height where you are looking straight ahead and you do not have to tilt your head forward or downward to look at the screen. Resting  When lying down and resting, avoid positions that are most painful for you.  If you have pain with activities such as sitting, bending, stooping, or squatting, lie in a position in which your body does not bend very much. For example, avoid curling up on your side with your arms and knees near your chest (fetal position).  If you have pain with activities such as standing for a long time or reaching with your arms, lie with your spine in a neutral position and bend your knees slightly. Try the following positions: ? Lying on your side with a pillow between your knees. ? Lying on your back with a pillow under your knees. Lifting   When lifting objects, keep your feet at least shoulder width apart and tighten your  abdominal muscles.  Bend your knees and hips and keep your spine neutral. It is important to lift using the strength of your legs, not your back. Do not lock your knees straight out.  Always ask for help to lift heavy or awkward objects. This information is not intended to replace advice given to you by your health care provider. Make sure you discuss any questions you have with your health care provider. Document Revised: 05/12/2018 Document Reviewed: 02/09/2018 Elsevier Patient Education  [place].  Seborrheic Keratosis A seborrheic keratosis is a common, noncancerous (benign) skin growth. These growths are velvety, waxy, rough, tan, brown, or black spots that appear on the skin. These skin growths can be flat or raised, and scaly. What are the causes? The cause of this condition is not known. What increases the risk? You are more likely to develop this condition if you:  Have a family history of seborrheic keratosis.  Are 50 or older.  Are pregnant.  Have had estrogen replacement therapy. What are the signs or symptoms? Symptoms of this condition include growths on the face, chest, shoulders, back, or other areas. These growths:  Are usually painless, but may become irritated and itchy.  Can be yellow, brown, black, or other colors.  Are slightly raised or have a flat surface.  Are sometimes rough or wart-like in texture.  Are often velvety or waxy on the surface.  Are round or oval-shaped.  Often occur in groups, but may occur as a single growth. How is this diagnosed? This condition is diagnosed with a medical history and physical exam.  A sample of the growth may be tested (skin biopsy).  You may need to see a skin specialist (dermatologist). How is this treated? Treatment is not usually needed for this condition, unless the growths are irritated or bleed often.  You may also choose to have the growths removed if you do not like their  appearance. ? Most commonly, these growths are treated with a procedure in which liquid nitrogen is applied to "freeze" off the growth (cryosurgery). ? They may also be burned off with electricity (electrocautery) or removed by scraping (curettage). Follow these instructions at home:  Watch your growth for any changes.  Keep all follow-up visits as told by your health care provider. This is important.  Do not scratch or  pick at the growth or growths. This can cause them to become irritated or infected. Contact a health care provider if:  You suddenly have many new growths.  Your growth bleeds, itches, or hurts.  Your growth suddenly becomes larger or changes color. Summary  A seborrheic keratosis is a common, noncancerous (benign) skin growth.  Treatment is not usually needed for this condition, unless the growths are irritated or bleed often.  Watch your growth for any changes.  Contact a health care provider if you suddenly have many new growths or your growth suddenly becomes larger or changes color.  Keep all follow-up visits as told by your health care provider. This is important. This information is not intended to replace advice given to you by your health care provider. Make sure you discuss any questions you have with your health care provider. Document Revised: 06/02/2017 Document Reviewed: 06/02/2017 Elsevier Patient Education  2020 [place].  Atrophic Vaginitis  Atrophic vaginitis is a condition in which the tissues that line the vagina become dry and thin. This condition is most common in women who have stopped having regular menstrual periods (are in menopause). This usually starts when a woman is 62-67 years old. That is the time when a woman's estrogen levels begin to drop (decrease). Estrogen is a female hormone. It helps to keep the tissues of the vagina moist. It stimulates the vagina to produce a clear fluid that lubricates the vagina for sexual  intercourse. This fluid also protects the vagina from infection. Lack of estrogen can cause the lining of the vagina to get thinner and dryer. The vagina may also shrink in size. It may become less elastic. Atrophic vaginitis tends to get worse over time as a woman's estrogen level drops. What are the causes? This condition is caused by the normal drop in estrogen that happens around the time of menopause. What increases the risk? Certain conditions or situations may lower a woman's estrogen level, leading to a higher risk for atrophic vaginitis. You are more likely to develop this condition if:  You are taking medicines that block estrogen.  You have had your ovaries removed.  You are being treated for cancer with X-ray (radiation) or medicines (chemotherapy).  You have given birth or are breastfeeding.  You are older than age 6.  You smoke. What are the signs or symptoms? Symptoms of this condition include:  Pain, soreness, or bleeding during sexual intercourse (dyspareunia).  Vaginal burning, irritation, or itching.  Pain or bleeding when a speculum is used in a vaginal exam (pelvic exam).  Having burning pain when passing urine.  Vaginal discharge that is brown or yellow. In some cases, there are no symptoms. How is this diagnosed? This condition is diagnosed by taking a medical history and doing a physical exam. This will include a pelvic exam that checks the vaginal tissues. Though rare, you may also have other tests, including:  A urine test.  A test that checks the acid balance in your vagina (acid balance test). How is this treated? Treatment for this condition depends on how severe your symptoms are. Treatment may include:  Using an over-the-counter vaginal lubricant before sex.  Using a long-acting vaginal moisturizer.  Using low-dose vaginal estrogen for moderate to severe symptoms that do not respond to other treatments. Options include creams, tablets, and  inserts (vaginal rings). Before you use a vaginal estrogen, tell your health care provider if you have a history of: ? Breast cancer. ? Endometrial cancer. ?  Blood clots. If you are not sexually active and your symptoms are very mild, you may not need treatment. Follow these instructions at home: Medicines  Take over-the-counter and prescription medicines only as told by your health care provider. Do not use herbal or alternative medicines unless your health care provider says that you can.  Use over-the-counter creams, lubricants, or moisturizers for dryness only as directed by your health care provider. General instructions  If your atrophic vaginitis is caused by menopause, discuss all of your menopause symptoms and treatment options with your health care provider.  Do not douche.  Do not use products that can make your vagina dry. These include: ? Scented feminine sprays. ? Scented tampons. ? Scented soaps.  Vaginal intercourse can help to improve blood flow and elasticity of vaginal tissue. If it hurts to have sex, try using a lubricant or moisturizer just before having intercourse. Contact a health care provider if:  Your discharge looks different than normal.  Your vagina has an unusual smell.  You have new symptoms.  Your symptoms do not improve with treatment.  Your symptoms get worse. Summary  Atrophic vaginitis is a condition in which the tissues that line the vagina become dry and thin. It is most common in women who have stopped having regular menstrual periods (are in menopause).  Treatment options include using vaginal lubricants and low-dose vaginal estrogen.  Contact a health care provider if your vagina has an unusual smell, or if your symptoms get worse or do not improve after treatment. This information is not intended to replace advice given to you by your health care provider. Make sure you discuss any questions you have with your health care  provider. Document Revised: 12/31/2016 Document Reviewed: 10/14/2016 Elsevier Patient Education  2020 [place].  Back Exercises The following exercises strengthen the muscles that help to support the trunk and back. They also help to keep the lower back flexible. Doing these exercises can help to prevent back pain or lessen existing pain.  If you have back pain or discomfort, try doing these exercises 2-3 times each day or as told by your health care provider.  As your pain improves, do them once each day, but increase the number of times that you repeat the steps for each exercise (do more repetitions).  To prevent the recurrence of back pain, continue to do these exercises once each day or as told by your health care provider. Do exercises exactly as told by your health care provider and adjust them as directed. It is normal to feel mild stretching, pulling, tightness, or discomfort as you do these exercises, but you should stop right away if you feel sudden pain or your pain gets worse. Exercises Single knee to chest Repeat these steps 3-5 times for each leg: 1. Lie on your back on a firm bed or the floor with your legs extended. 2. Bring one knee to your chest. Your other leg should stay extended and in contact with the floor. 3. Hold your knee in place by grabbing your knee or thigh with both hands and hold. 4. Pull on your knee until you feel a gentle stretch in your lower back or buttocks. 5. Hold the stretch for 10-30 seconds. 6. Slowly release and straighten your leg. Pelvic tilt Repeat these steps 5-10 times: 1. Lie on your back on a firm bed or the floor with your legs extended. 2. Bend your knees so they are pointing toward the ceiling and your feet  are flat on the floor. 3. Tighten your lower abdominal muscles to press your lower back against the floor. This motion will tilt your pelvis so your tailbone points up toward the ceiling instead of pointing to your feet or the  floor. 4. With gentle tension and even breathing, hold this position for 5-10 seconds. Cat-cow Repeat these steps until your lower back becomes more flexible: 1. Get into a hands-and-knees position on a firm surface. Keep your hands under your shoulders, and keep your knees under your hips. You may place padding under your knees for comfort. 2. Let your head hang down toward your chest. Contract your abdominal muscles and point your tailbone toward the floor so your lower back becomes rounded like the back of a cat. 3. Hold this position for 5 seconds. 4. Slowly lift your head, let your abdominal muscles relax and point your tailbone up toward the ceiling so your back forms a sagging arch like the back of a cow. 5. Hold this position for 5 seconds.  Press-ups Repeat these steps 5-10 times: 1. Lie on your abdomen (face-down) on the floor. 2. Place your palms near your head, about shoulder-width apart. 3. Keeping your back as relaxed as possible and keeping your hips on the floor, slowly straighten your arms to raise the top half of your body and lift your shoulders. Do not use your back muscles to raise your upper torso. You may adjust the placement of your hands to make yourself more comfortable. 4. Hold this position for 5 seconds while you keep your back relaxed. 5. Slowly return to lying flat on the floor.  Bridges Repeat these steps 10 times: 1. Lie on your back on a firm surface. 2. Bend your knees so they are pointing toward the ceiling and your feet are flat on the floor. Your arms should be flat at your sides, next to your body. 3. Tighten your buttocks muscles and lift your buttocks off the floor until your waist is at almost the same height as your knees. You should feel the muscles working in your buttocks and the back of your thighs. If you do not feel these muscles, slide your feet 1-2 inches farther away from your buttocks. 4. Hold this position for 3-5 seconds. 5. Slowly lower  your hips to the starting position, and allow your buttocks muscles to relax completely. If this exercise is too easy, try doing it with your arms crossed over your chest. Abdominal crunches Repeat these steps 5-10 times: 1. Lie on your back on a firm bed or the floor with your legs extended. 2. Bend your knees so they are pointing toward the ceiling and your feet are flat on the floor. 3. Cross your arms over your chest. 4. Tip your chin slightly toward your chest without bending your neck. 5. Tighten your abdominal muscles and slowly raise your trunk (torso) high enough to lift your shoulder blades a tiny bit off the floor. Avoid raising your torso higher than that because it can put too much stress on your low back and does not help to strengthen your abdominal muscles. 6. Slowly return to your starting position. Back lifts Repeat these steps 5-10 times: 1. Lie on your abdomen (face-down) with your arms at your sides, and rest your forehead on the floor. 2. Tighten the muscles in your legs and your buttocks. 3. Slowly lift your chest off the floor while you keep your hips pressed to the floor. Keep the back of your  head in line with the curve in your back. Your eyes should be looking at the floor. 4. Hold this position for 3-5 seconds. 5. Slowly return to your starting position. Contact a health care provider if:  Your back pain or discomfort gets much worse when you do an exercise.  Your worsening back pain or discomfort does not lessen within 2 hours after you exercise. If you have any of these problems, stop doing these exercises right away. Do not do them again unless your health care provider says that you can. Get help right away if:  You develop sudden, severe back pain. If this happens, stop doing the exercises right away. Do not do them again unless your health care provider says that you can. This information is not intended to replace advice given to you by your health care  provider. Make sure you discuss any questions you have with your health care provider. Document Revised: 05/25/2018 Document Reviewed: 10/20/2017 Elsevier Patient Education  [place].

## 2019-03-22 NOTE — Progress Notes (Signed)
Virtual Visit via Video Note  I connected with Patricia Curtis  on 03/22/19 at  2:50 PM EST by a video enabled telemedicine application and verified that I am speaking with the correct person using two identifiers.  Location patient: home Location provider:work or home office Persons participating in the virtual visit: patient, provider, husband  I discussed the limitations of evaluation and management by telemedicine and the availability of in person appointments. The patient expressed understanding and agreed to proceed.   HPI: htn on losartan 50 mg qd has not checked BP lost machine trying to find feeling well   Chronic low back pain 4/10 today w/o tramadol but tramadol qd helps and disc back exercises tumueric and salonpas  Needs refills tramadol, baclofen   covid vx 2nd dose sch 03/24/19   Skin lesions to legs and trunk f/u dermatology 05/23/19   Vaginal dryness replens qod suppos. Messy helps some estrace was expensive declines to try other estrogen creams due to cost  Prediabetes to DM last A1C 11/2018 6.7   ROS: See pertinent positives and negatives per HPI.  Past Medical History:  Diagnosis Date  . Arthritis   . Family history of heart attack   . Herpes   . Hyperlipidemia   . Hypertension   . Kidney disease    not specified; [facility] 04/20/16   . Prediabetes   . Syncope    2/2 neck and back pain in the past  . Vitamin D deficiency     Past Surgical History:  Procedure Laterality Date  . ABDOMINAL HYSTERECTOMY     age 51 y.o ? reason per pt DUB no h/o abnormal pap ovaries intact   . APPENDECTOMY     1963  . BREAST CYST ASPIRATION Right 1994  . BREAST SURGERY     bx right breast 2001 benign     Family History  Problem Relation Age of Onset  . Heart disease Mother        MI  . Cancer Father        prostate to bone  . Early death Brother   . Heart disease Brother        MI  . Stroke Brother   . Miscarriages / [place] Daughter   .  Hyperlipidemia Son   . Heart disease Maternal Grandfather   . Cancer Brother        multiple myeloma  . Heart disease Brother        MI  . Hyperlipidemia Brother   . Hypertension Brother   . Hypertension Brother   . Hyperlipidemia Brother   . Heart disease Brother        MI  . Cancer Brother        prostate   . Breast cancer Neg Hx     SOCIAL HX: lives with husband Sonia Side   Current Outpatient Medications:  .  Ascorbic Acid (VITAMIN C) 1000 MG tablet, Take 1,000 mg by mouth daily., Disp: , Rfl:  .  aspirin EC 81 MG tablet, Take by mouth daily. , Disp: , Rfl:  .  baclofen (LIORESAL) 10 MG tablet, Take 1 tablet (10 mg total) by mouth at bedtime as needed for muscle spasms., Disp: 90 each, Rfl: 3 .  calcium carbonate (OSCAL) 1500 (600 Ca) MG TABS tablet, Take 600 mg of elemental calcium by mouth 2 (two) times daily with a meal., Disp: , Rfl:  .  Cholecalciferol (VITAMIN D-3) 125 MCG (5000 UT) TABS, Take by mouth., Disp: ,  Rfl:  .  cycloSPORINE (RESTASIS) 0.05 % ophthalmic emulsion, 1 drop 2 (two) times daily., Disp: , Rfl:  .  DULoxetine (CYMBALTA) 30 MG capsule, Take 1 capsule (30 mg total) by mouth daily., Disp: 90 capsule, Rfl: 3 .  furosemide (LASIX) 20 MG tablet, Take 0.5 tablets (10 mg total) by mouth daily as needed. Prn, Disp: 45 tablet, Rfl: 11 .  losartan (COZAAR) 50 MG tablet, Take 1 tablet (50 mg total) by mouth daily., Disp: 90 tablet, Rfl: 3 .  Melatonin 3 MG TABS, Take by mouth daily. , Disp: , Rfl:  .  rosuvastatin (CRESTOR) 40 MG tablet, Take 1 tablet (40 mg total) by mouth daily., Disp: 90 tablet, Rfl: 3 .  traMADol (ULTRAM) 50 MG tablet, Take 1 tablet (50 mg total) by mouth every 12 (twelve) hours as needed., Disp: 60 tablet, Rfl: 2 .  valACYclovir (VALTREX) 1000 MG tablet, Take 1 tablet (1,000 mg total) by mouth 2 (two) times daily. X 7-10 days outbreak, Disp: 30 tablet, Rfl: 12  EXAM:  VITALS per patient if applicable:  GENERAL: alert, oriented, appears well and  in no acute distress  HEENT: atraumatic, conjunttiva clear, no obvious abnormalities on inspection of external nose and ears  NECK: normal movements of the head and neck  LUNGS: on inspection no signs of respiratory distress, breathing rate appears normal, no obvious gross SOB, gasping or wheezing  CV: no obvious cyanosis  MS: moves all visible extremities without noticeable abnormality  PSYCH/NEURO: pleasant and cooperative, no obvious depression or anxiety, speech and thought processing grossly intact  ASSESSMENT AND PLAN:  Discussed the following assessment and plan:  Essential hypertension - Plan: losartan (COZAAR) 50 MG tablet, Lipid panel, Comprehensive metabolic panel, CBC w/Diff  Cervicalgia - Plan: baclofen (LIORESAL) 10 MG tablet, DISCONTINUED: baclofen (LIORESAL) 10 MG tablet  Chronic low back pain, unspecified back pain laterality, unspecified whether sciatica present - Plan: traMADol (ULTRAM) 50 MG tablet, baclofen (LIORESAL) 10 MG tablet, DISCONTINUED: baclofen (LIORESAL) 10 MG tablet  Chronic neck pain - Plan: traMADol (ULTRAM) 50 MG tablet  Seborrheic keratosis f/u derm 05/2019 Dr. Kellie Moor   Vaginal dryness, menopausal Declines other estrogen creams for now using replens   Type 2 diabetes mellitus without complication, without long-term current use of insulin ([place]) - Plan: Hemoglobin A1c  HM Flu shot utd 9 /24/20 tdap utd shingrix had 01/07/17 and 06/23/17 No proof of prevnar or pna 23 discuss in future  MMR, hep B immune  Hep C neg covid vx 03/03/19 1/2 2nd dose sch 03/24/19    Mammogram neg 12/08/18 colonoscopy 06/27/15 [place] [facility] small polyp sessile tubular f/u in 3 yearsto 5 years Never smoker  S/p hysterectomy DUB ovaries intact no h/o abnormal pap DEXA sch 04/11/2018+osteopenia T score -2.1 rec calcium and vitamin Don 40K IU qd   [employer] [place] eye exam no DM retinopathy f/u in 1 year 06/01/2018  -we  discussed possible serious and likely etiologies, options for evaluation and workup, limitations of telemedicine visit vs in person visit, treatment, treatment risks and precautions. Pt prefers to treat via telemedicine empirically rather then risking or undertaking an in person visit at this moment. Patient agrees to seek prompt in person care if worsening, new symptoms arise, or if is not improving with treatment.   I discussed the assessment and treatment plan with the patient. The patient was provided an opportunity to ask questions and all were answered. The patient agreed with the plan and demonstrated an  understanding of the instructions.   The patient was advised to call back or seek an in-person evaluation if the symptoms worsen or if the condition fails to improve as anticipated.  Time spent 20-29  Delorise Jackson, MD

## 2019-03-24 DIAGNOSIS — Z23 Encounter for immunization: Secondary | ICD-10-CM | POA: Diagnosis not present

## 2019-05-23 ENCOUNTER — Telehealth: Payer: Self-pay | Admitting: Internal Medicine

## 2019-05-23 NOTE — Telephone Encounter (Signed)
"  Rejection Reason - Patient Declined - Patient declined to R/S- no reason given " [facility] PA said 36 minutes ago

## 2019-05-25 ENCOUNTER — Other Ambulatory Visit: Payer: Self-pay

## 2019-05-25 ENCOUNTER — Other Ambulatory Visit (INDEPENDENT_AMBULATORY_CARE_PROVIDER_SITE_OTHER): Payer: Medicare Other

## 2019-05-25 DIAGNOSIS — E119 Type 2 diabetes mellitus without complications: Secondary | ICD-10-CM | POA: Diagnosis not present

## 2019-05-25 DIAGNOSIS — I10 Essential (primary) hypertension: Secondary | ICD-10-CM | POA: Diagnosis not present

## 2019-05-25 LAB — COMPREHENSIVE METABOLIC PANEL
ALT: 19 U/L (ref 0–35)
AST: 24 U/L (ref 0–37)
Albumin: 4.5 g/dL (ref 3.5–5.2)
Alkaline Phosphatase: 58 U/L (ref 39–117)
BUN: 17 mg/dL (ref 6–23)
CO2: 30 mEq/L (ref 19–32)
Calcium: 10.1 mg/dL (ref 8.4–10.5)
Chloride: 102 mEq/L (ref 96–112)
Creatinine, Ser: 1 mg/dL (ref 0.40–1.20)
GFR: 54.46 mL/min — ABNORMAL LOW (ref >60.00)
Glucose, Bld: 105 mg/dL — ABNORMAL HIGH (ref 70–99)
Potassium: 3.9 mEq/L (ref 3.5–5.1)
Sodium: 139 mEq/L (ref 135–145)
Total Bilirubin: 0.5 mg/dL (ref 0.2–1.2)
Total Protein: 7.3 g/dL (ref 6.0–8.3)

## 2019-05-25 LAB — CBC WITH DIFFERENTIAL/PLATELET
Basophils Absolute: 0.1 10*3/uL (ref 0.0–0.1)
Basophils Relative: 1.3 % (ref 0.0–3.0)
Eosinophils Absolute: 0.2 10*3/uL (ref 0.0–0.7)
Eosinophils Relative: 3.9 % (ref 0.0–5.0)
HCT: 41.1 % (ref 36.0–46.0)
Hemoglobin: 13.6 g/dL (ref 12.0–15.0)
Lymphocytes Relative: 37.4 % (ref 12.0–46.0)
Lymphs Abs: 2 10*3/uL (ref 0.7–4.0)
MCHC: 33 g/dL (ref 30.0–36.0)
MCV: 86.4 fl (ref 78.0–100.0)
Monocytes Absolute: 0.5 10*3/uL (ref 0.1–1.0)
Monocytes Relative: 8.5 % (ref 3.0–12.0)
Neutro Abs: 2.7 10*3/uL (ref 1.4–7.7)
Neutrophils Relative %: 48.9 % (ref 43.0–77.0)
Platelets: 226 10*3/uL (ref 150.0–400.0)
RBC: 4.76 Mil/uL (ref 3.87–5.11)
RDW: 14.2 % (ref 11.5–15.5)
WBC: 5.4 10*3/uL (ref 4.0–10.5)

## 2019-05-25 LAB — LIPID PANEL
Cholesterol: 154 mg/dL (ref 0–200)
HDL: 73.5 mg/dL (ref >39.00)
LDL Cholesterol: 64 mg/dL (ref 0–99)
NonHDL: 80.91
Total CHOL/HDL Ratio: 2
Triglycerides: 84 mg/dL (ref 0.0–149.0)
VLDL: 16.8 mg/dL (ref 0.0–40.0)

## 2019-05-25 LAB — HEMOGLOBIN A1C: Hgb A1c MFr Bld: 6.5 % (ref 4.6–6.5)

## 2019-06-13 ENCOUNTER — Encounter: Payer: Self-pay | Admitting: Internal Medicine

## 2019-06-13 DIAGNOSIS — E119 Type 2 diabetes mellitus without complications: Secondary | ICD-10-CM | POA: Diagnosis not present

## 2019-06-13 DIAGNOSIS — H524 Presbyopia: Secondary | ICD-10-CM | POA: Diagnosis not present

## 2019-06-13 DIAGNOSIS — H40013 Open angle with borderline findings, low risk, bilateral: Secondary | ICD-10-CM | POA: Diagnosis not present

## 2019-06-13 LAB — HM DIABETES EYE EXAM

## 2019-06-15 DIAGNOSIS — H25813 Combined forms of age-related cataract, bilateral: Secondary | ICD-10-CM | POA: Diagnosis not present

## 2019-08-12 ENCOUNTER — Encounter: Payer: Self-pay | Admitting: Internal Medicine

## 2019-08-12 DIAGNOSIS — G8929 Other chronic pain: Secondary | ICD-10-CM

## 2019-08-12 DIAGNOSIS — E785 Hyperlipidemia, unspecified: Secondary | ICD-10-CM

## 2019-08-13 ENCOUNTER — Other Ambulatory Visit: Payer: Self-pay | Admitting: Internal Medicine

## 2019-08-13 DIAGNOSIS — E785 Hyperlipidemia, unspecified: Secondary | ICD-10-CM

## 2019-08-13 DIAGNOSIS — B009 Herpesviral infection, unspecified: Secondary | ICD-10-CM

## 2019-08-13 DIAGNOSIS — G8929 Other chronic pain: Secondary | ICD-10-CM

## 2019-08-13 MED ORDER — ROSUVASTATIN CALCIUM 40 MG PO TABS: 40.0000 mg | ORAL_TABLET | Freq: Every day | ORAL | 1 refills | Status: DC

## 2019-08-13 MED ORDER — DULOXETINE HCL 30 MG PO CPEP: 30.0000 mg | ORAL_CAPSULE | Freq: Every day | ORAL | 3 refills | Status: DC

## 2019-08-13 MED ORDER — DULOXETINE HCL 30 MG PO CPEP: 30.0000 mg | ORAL_CAPSULE | Freq: Every day | ORAL | 1 refills | Status: DC

## 2019-08-13 MED ORDER — VALACYCLOVIR HCL 1 G PO TABS: 1000.0000 mg | ORAL_TABLET | Freq: Two times a day (BID) | ORAL | 12 refills | Status: DC

## 2019-08-13 MED ORDER — ROSUVASTATIN CALCIUM 40 MG PO TABS: 40.0000 mg | ORAL_TABLET | Freq: Every day | ORAL | 3 refills | Status: DC

## 2019-08-27 DIAGNOSIS — Z87891 Personal history of nicotine dependence: Secondary | ICD-10-CM | POA: Diagnosis not present

## 2019-08-27 DIAGNOSIS — Z7982 Long term (current) use of aspirin: Secondary | ICD-10-CM | POA: Diagnosis not present

## 2019-08-27 DIAGNOSIS — H2511 Age-related nuclear cataract, right eye: Secondary | ICD-10-CM | POA: Diagnosis not present

## 2019-08-27 DIAGNOSIS — E7800 Pure hypercholesterolemia, unspecified: Secondary | ICD-10-CM | POA: Diagnosis not present

## 2019-08-27 DIAGNOSIS — I10 Essential (primary) hypertension: Secondary | ICD-10-CM | POA: Diagnosis not present

## 2019-08-27 DIAGNOSIS — E785 Hyperlipidemia, unspecified: Secondary | ICD-10-CM | POA: Diagnosis not present

## 2019-08-27 DIAGNOSIS — Z79899 Other long term (current) drug therapy: Secondary | ICD-10-CM | POA: Diagnosis not present

## 2019-09-07 ENCOUNTER — Ambulatory Visit: Payer: Medicare Other | Admitting: Internal Medicine

## 2019-09-07 ENCOUNTER — Encounter: Payer: Self-pay | Admitting: Internal Medicine

## 2019-09-12 ENCOUNTER — Other Ambulatory Visit: Payer: Self-pay

## 2019-09-14 ENCOUNTER — Other Ambulatory Visit: Payer: Self-pay

## 2019-09-14 ENCOUNTER — Encounter: Payer: Self-pay | Admitting: Internal Medicine

## 2019-09-14 ENCOUNTER — Ambulatory Visit (INDEPENDENT_AMBULATORY_CARE_PROVIDER_SITE_OTHER): Payer: Medicare Other | Admitting: Internal Medicine

## 2019-09-14 VITALS — BP 124/82 | HR 91 | Temp 98.6°F | Ht 64.0 in | Wt 159.0 lb

## 2019-09-14 DIAGNOSIS — Z1231 Encounter for screening mammogram for malignant neoplasm of breast: Secondary | ICD-10-CM

## 2019-09-14 DIAGNOSIS — G8929 Other chronic pain: Secondary | ICD-10-CM

## 2019-09-14 DIAGNOSIS — I152 Hypertension secondary to endocrine disorders: Secondary | ICD-10-CM

## 2019-09-14 DIAGNOSIS — R0683 Snoring: Secondary | ICD-10-CM | POA: Diagnosis not present

## 2019-09-14 DIAGNOSIS — M545 Low back pain, unspecified: Secondary | ICD-10-CM

## 2019-09-14 DIAGNOSIS — I10 Essential (primary) hypertension: Secondary | ICD-10-CM | POA: Diagnosis not present

## 2019-09-14 DIAGNOSIS — M542 Cervicalgia: Secondary | ICD-10-CM

## 2019-09-14 DIAGNOSIS — E1159 Type 2 diabetes mellitus with other circulatory complications: Secondary | ICD-10-CM

## 2019-09-14 DIAGNOSIS — Z1283 Encounter for screening for malignant neoplasm of skin: Secondary | ICD-10-CM

## 2019-09-14 DIAGNOSIS — Z1389 Encounter for screening for other disorder: Secondary | ICD-10-CM

## 2019-09-14 DIAGNOSIS — Z1329 Encounter for screening for other suspected endocrine disorder: Secondary | ICD-10-CM | POA: Diagnosis not present

## 2019-09-14 DIAGNOSIS — N1831 Chronic kidney disease, stage 3a: Secondary | ICD-10-CM | POA: Diagnosis not present

## 2019-09-14 DIAGNOSIS — E663 Overweight: Secondary | ICD-10-CM | POA: Diagnosis not present

## 2019-09-14 MED ORDER — TRAMADOL HCL 50 MG PO TABS: 50.0000 mg | ORAL_TABLET | Freq: Two times a day (BID) | ORAL | 2 refills | Status: DC | PRN

## 2019-09-14 NOTE — Patient Instructions (Addendum)
cerave hylauranic acid serum and they retinol products   Chronic Kidney Disease, Adult Chronic kidney disease (CKD) happens when the kidneys are damaged over a long period of time. The kidneys are two organs that help with:  Getting rid of waste and extra fluid from the blood.  Making hormones that maintain the amount of fluid in your tissues and blood vessels.  Making sure that the body has the right amount of fluids and chemicals. Most of the time, CKD does not go away, but it can usually be controlled. Steps must be taken to slow down the kidney damage or to stop it from getting worse. If this is not done, the kidneys may stop working. Follow these instructions at home: Medicines  Take over-the-counter and prescription medicines only as told by your doctor. You may need to change the amount of medicines you take.  Do not take any new medicines unless your doctor says it is okay. Many medicines can make your kidney damage worse.  Do not take any vitamin and supplements unless your doctor says it is okay. Many vitamins and supplements can make your kidney damage worse. General instructions  Follow a diet as told by your doctor. You may need to stay away from: ? Alcohol. ? Salty foods. ? Foods that are high in:  Potassium.  Calcium.  Protein.  Do not use any products that contain nicotine or tobacco, such as cigarettes and e-cigarettes. If you need help quitting, ask your doctor.  Keep track of your blood pressure at home. Tell your doctor about any changes.  If you have diabetes, keep track of your blood sugar as told by your doctor.  Try to stay at a healthy weight. If you need help, ask your doctor.  Exercise at least 30 minutes a day, 5 days a week.  Stay up-to-date with your shots (immunizations) as told by your doctor.  Keep all follow-up visits as told by your doctor. This is important. Contact a doctor if:  Your symptoms get worse.  You have new  symptoms. Get help right away if:  You have symptoms of end-stage kidney disease. These may include: ? Headaches. ? Numbness in your hands or feet. ? Easy bruising. ? Having hiccups often. ? Chest pain. ? Shortness of breath. ? Stopping of menstrual periods in women.  You have a fever.  You have very little pee (urine).  You have pain or bleeding when you pee. Summary  Chronic kidney disease (CKD) happens when the kidneys are damaged over a long period of time.  Most of the time, this condition does not go away, but it can usually be controlled. Steps must be taken to slow down the kidney damage or to stop it from getting worse.  Treatment may include a combination of medicines and lifestyle changes. This information is not intended to replace advice given to you by your health care provider. Make sure you discuss any questions you have with your health care provider. Document Revised: 12/31/2016 Document Reviewed: 02/23/2016 Elsevier Patient Education  2020 [employer] for Chronic Kidney Disease When your kidneys are not working well, they cannot remove waste and excess substances from your blood as effectively as they did before. This can lead to a buildup and imbalance of these substances, which can worsen kidney damage and affect how your body functions. Certain foods lead to a buildup of these substances in the body. By changing your diet as recommended by your diet and nutrition  specialist (dietitian) or health care provider, you could help prevent further kidney damage and delay or prevent the need for dialysis. What are tips for following this plan? General instructions   Work with your health care provider and dietitian to develop a meal plan that is right for you. Foods you can eat, limit, or avoid will be different for each person depending on the stage of kidney disease and any other existing health conditions.  Talk with your health care provider about  whether you should take a vitamin and mineral supplement.  Use standard measuring cups and spoons to measure servings of foods. Use a kitchen scale to measure portions of protein foods.  If directed by your health care provider, avoid drinking too much fluid. Measure and count all liquids, including water, ice, soups, flavored gelatin, and frozen desserts such as popsicles or ice cream. Reading food labels  Check the amount of sodium in foods. Choose foods that have less than 300 milligrams (mg) per serving.  Check the ingredient list for phosphorus or potassium-based additives or preservatives.  Check the amount of saturated and trans fat. Limit or avoid these fats as told by your dietitian. Shopping  Avoid buying foods that are: ? Processed, frozen, or prepackaged. ? Calcium-enriched or fortified.  Do not buy foods that have salt or sodium listed among the first five ingredients.  Do not buy canned vegetables. Cooking  Replace animal proteins, such as meat, fish, eggs, or dairy, with plant proteins from beans, nuts, and soy. ? Use soy milk instead of cow's milk. ? Add beans or tofu to soups, casseroles, or pasta dishes instead of meat.  Soak vegetables, such as potatoes, before cooking to reduce potassium. To do this: ? Peel and cut into small pieces. ? Soak in warm water for at least 2 hours. For every 1 cup of vegetables, use 10 cups of water. ? Drain and rinse with warm water. ? Boil for at least 5 minutes. Meal planning  Limit the amount of protein from plant and animal sources you eat each day.  Do not add salt to food when cooking or before eating.  Eat meals and snacks at around the same time each day. If you have diabetes:  If you have diabetes (diabetes mellitus) and chronic kidney disease, it is important to keep your blood glucose in the target range recommended by your health care provider. Follow your diabetes management plan. This may include: ? Checking your  blood glucose regularly. ? Taking oral medicines, insulin, or both. ? Exercising for at least 30 minutes on 5 or more days each week, or as told by your health care provider. ? Tracking how many servings of carbohydrates you eat at each meal.  You may be given specific guidelines on how much of certain foods and nutrients you may eat, depending on your stage of kidney disease and whether you have high blood pressure (hypertension). Follow your meal plan as told by your dietitian. What nutrients should be limited? The items listed are not a complete list. Talk with your dietitian about what dietary choices are best for you. Potassium Potassium affects how steadily your heart beats. If too much potassium builds up in your blood, it can cause an irregular heartbeat or even a heart attack. You may need to eat less potassium, depending on your blood potassium levels and the stage of kidney disease. Talk to your dietitian about how much potassium you may have each day. You may need to limit  or avoid foods that are high in potassium, such as:  Milk and soy milk.  Fruits, such as bananas, papaya, apricots, nectarines, melon, prunes, raisins, kiwi, and oranges.  Vegetables, such as potatoes, sweet potatoes, yams, tomatoes, leafy greens, beets, okra, avocado, pumpkin, and winter squash.  White and lima beans. Phosphorus Phosphorus is a mineral found in your bones. A balance between calcium and phosphorous is needed to build and maintain healthy bones. Too much phosphorus pulls calcium from your bones. This can make your bones weak and more likely to break. Too much phosphorus can also make your skin itch. You may need to eat less phosphorus depending on your blood phosphorus levels and the stage of kidney disease. Talk to your dietitian about how much potassium you may have each day. You may need to take medicine to lower your blood phosphorus levels if diet changes do not help. You may need to limit or  avoid foods that are high in phosphorus, such as:  Milk and dairy products.  Dried beans and peas.  Tofu, soy milk, and other soy-based meat replacements.  Colas.  Nuts and peanut butter.  Meat, poultry, and fish.  Bran cereals and oatmeals. Protein Protein helps you to make and keep muscle. It also helps in the repair of your body's cells and tissues. One of the natural breakdown products of protein is a waste product called urea. When your kidneys are not working properly, they cannot remove wastes, such as urea, like they did before you developed chronic kidney disease. Reducing how much protein you eat can help prevent a buildup of urea in your blood. Depending on your stage of kidney disease, you may need to limit foods that are high in protein. Sources of animal protein include:  Meat (all types).  Fish and seafood.  Poultry.  Eggs.  Dairy. Other protein foods include:  Beans and legumes.  Nuts and nut butter.  Soy and tofu. Sodium Sodium, which is found in salt, helps maintain a healthy balance of fluids in your body. Too much sodium can increase your blood pressure and have a negative effect on the function of your heart and lungs. Too much sodium can also cause your body to retain too much fluid, making your kidneys work harder. Most people should have less than 2,300 milligrams (mg) of sodium each day. If you have hypertension, you may need to limit your sodium to 1,500 mg each day. Talk to your dietitian about how much sodium you may have each day. You may need to limit or avoid foods that are high in sodium, such as:  Salt seasonings.  Soy sauce.  Cured and processed meats.  Salted crackers and snack foods.  Fast food.  Canned soups and most canned foods.  Pickled foods.  Vegetable juice.  Boxed mixes or ready-to-eat boxed meals and side dishes.  Bottled dressings, sauces, and marinades. Summary  Chronic kidney disease can lead to a buildup and  imbalance of waste and excess substances in the body. Certain foods lead to a buildup of these substances. By adjusting your intake of these foods, you could help prevent more kidney damage and delay or prevent the need for dialysis.  Food adjustments are different for each person with chronic kidney disease. Work with a dietitian to set up nutrient goals and a meal plan that is right for you.  If you have diabetes and chronic kidney disease, it is important to keep your blood glucose in the target range recommended by  your health care provider. This information is not intended to replace advice given to you by your health care provider. Make sure you discuss any questions you have with your health care provider. Document Revised: 05/11/2018 Document Reviewed: 01/14/2016 Elsevier Patient Education  2020 [employer].

## 2019-09-14 NOTE — Progress Notes (Signed)
Chief Complaint  Patient presents with  . Follow-up   F/u 1. BP controlled on losartan 50 and lasix 20  2. Chronic pain wants refill of tramadol 50   Review of Systems  Constitutional: Negative for weight loss.  HENT: Negative for hearing loss.   Eyes: Negative for blurred vision.  Respiratory: Negative for shortness of breath.   Cardiovascular: Negative for chest pain.  Gastrointestinal: Negative for abdominal pain.  Musculoskeletal: Negative for falls.  Skin: Negative for rash.  Neurological: Negative for headaches.  Psychiatric/Behavioral: Negative for depression.   Past Medical History:  Diagnosis Date  . Arthritis   . Family history of heart attack   . Herpes   . Hyperlipidemia   . Hypertension   . Kidney disease    not specified; [facility] 04/20/16   . Prediabetes   . Syncope    2/2 neck and back pain in the past  . Vitamin D deficiency    Past Surgical History:  Procedure Laterality Date  . ABDOMINAL HYSTERECTOMY     age 74 y.o ? reason per pt DUB no h/o abnormal pap ovaries intact   . APPENDECTOMY     1963  . BREAST CYST ASPIRATION Right 1994  . BREAST SURGERY     bx right breast 2001 benign   . cataract surgery     08/27/19 [facility] [place]; right Dr. Adella Nissen   Family History  Problem Relation Age of Onset  . Heart disease Mother        MI  . Cancer Father        prostate to bone  . Early death Brother   . Heart disease Brother        MI  . Stroke Brother   . Miscarriages / [place] Daughter   . Hyperlipidemia Son   . Heart disease Maternal Grandfather   . Cancer Brother        multiple myeloma  . Heart disease Brother        MI  . Hyperlipidemia Brother   . Hypertension Brother   . Kidney cancer Brother   . Skin cancer Brother   . Hypertension Brother   . Hyperlipidemia Brother   . Heart disease Brother        MI  . Cancer Brother        prostate   . Breast cancer Neg Hx    Social History   Socioeconomic History  . Marital  status: Married    Spouse name: Not on file  . Number of children: Not on file  . Years of education: Not on file  . Highest education level: Not on file  Occupational History  . Not on file  Tobacco Use  . Smoking status: Never Smoker  . Smokeless tobacco: Never Used  Substance and Sexual Activity  . Alcohol use: Not Currently  . Drug use: Not Currently  . Sexual activity: Not Currently    Partners: Male  Other Topics Concern  . Not on file  Social History Narrative   Married    2 kids son and daughter    HS ed    Press photographer, retired    No guns    Wears seat belt    Safe in relationship    Social Determinants of Health   Financial Resource Strain: Low Risk   . Difficulty of Paying Living Expenses: Not hard at all  Food Insecurity: No Food Insecurity  . Worried About Charity fundraiser in the Last  Year: Never true  . Ran Out of Food in the Last Year: Never true  Transportation Needs: No Transportation Needs  . Lack of Transportation (Medical): No  . Lack of Transportation (Non-Medical): No  Physical Activity: Sufficiently Active  . Days of Exercise per Week: 7 days  . Minutes of Exercise per Session: 30 min  Stress: No Stress Concern Present  . Feeling of Stress : Not at all  Social Connections:   . Frequency of Communication with Friends and Family:   . Frequency of Social Gatherings with Friends and Family:   . Attends Religious Services:   . Active Member of Clubs or Organizations:   . Attends Archivist Meetings:   Marland Kitchen Marital Status:   Intimate Partner Violence: Not At Risk  . Fear of Current or Ex-Partner: No  . Emotionally Abused: No  . Physically Abused: No  . Sexually Abused: No   Current Meds  Medication Sig  . Ascorbic Acid (VITAMIN C) 1000 MG tablet Take 1,000 mg by mouth daily.  Marland Kitchen aspirin EC 81 MG tablet Take by mouth daily.   . baclofen (LIORESAL) 10 MG tablet Take 1 tablet (10 mg total) by mouth at bedtime as needed for muscle spasms.   . calcium carbonate (OSCAL) 1500 (600 Ca) MG TABS tablet Take 600 mg of elemental calcium by mouth 2 (two) times daily with a meal.  . Cholecalciferol (VITAMIN D-3) 125 MCG (5000 UT) TABS Take by mouth.  . cycloSPORINE (RESTASIS) 0.05 % ophthalmic emulsion 1 drop 2 (two) times daily.  . DULoxetine (CYMBALTA) 30 MG capsule Take 1 capsule (30 mg total) by mouth daily.  . furosemide (LASIX) 20 MG tablet Take 0.5 tablets (10 mg total) by mouth daily as needed. Prn  . losartan (COZAAR) 50 MG tablet Take 1 tablet (50 mg total) by mouth daily.  . Melatonin 3 MG TABS Take by mouth daily.   . rosuvastatin (CRESTOR) 40 MG tablet Take 1 tablet (40 mg total) by mouth daily.  . traMADol (ULTRAM) 50 MG tablet Take 1 tablet (50 mg total) by mouth every 12 (twelve) hours as needed.  . valACYclovir (VALTREX) 1000 MG tablet Take 1 tablet (1,000 mg total) by mouth 2 (two) times daily. X 7-10 days outbreak  . [DISCONTINUED] traMADol (ULTRAM) 50 MG tablet Take 1 tablet (50 mg total) by mouth every 12 (twelve) hours as needed.   Allergies  Allergen Reactions  . Lisinopril     Cough    No results found for this or any previous visit (from the past 2160 hour(s)). Objective  Body mass index is 27.29 kg/m. Wt Readings from Last 3 Encounters:  09/14/19 159 lb (72.1 kg)  03/22/19 153 lb 9.6 oz (69.7 kg)  11/22/18 153 lb 9.6 oz (69.7 kg)   Temp Readings from Last 3 Encounters:  09/14/19 98.6 F (37 C) (Oral)  11/22/18 97.6 F (36.4 C) (Oral)  03/10/18 98.3 F (36.8 C) (Oral)   BP Readings from Last 3 Encounters:  09/14/19 124/82  11/22/18 134/70  10/17/18 132/80   Pulse Readings from Last 3 Encounters:  09/14/19 91  11/22/18 67  10/17/18 83    Physical Exam Vitals and nursing note reviewed.  Constitutional:      Appearance: Normal appearance. She is well-developed, well-groomed and overweight.  HENT:     Head: Normocephalic and atraumatic.  Eyes:     Conjunctiva/sclera: Conjunctivae normal.      Pupils: Pupils are equal, round, and reactive to light.  Cardiovascular:     Rate and Rhythm: Normal rate and regular rhythm.     Heart sounds: Normal heart sounds. No murmur heard.   Pulmonary:     Effort: Pulmonary effort is normal.     Breath sounds: Normal breath sounds.  Abdominal:     General: Abdomen is flat. Bowel sounds are normal.     Tenderness: There is no abdominal tenderness.  Skin:    General: Skin is warm and dry.  Neurological:     General: No focal deficit present.     Mental Status: She is alert and oriented to person, place, and time. Mental status is at baseline.     Gait: Gait normal.  Psychiatric:        Attention and Perception: Attention and perception normal.        Mood and Affect: Mood and affect normal.        Speech: Speech normal.        Behavior: Behavior normal. Behavior is cooperative.        Thought Content: Thought content normal.        Cognition and Memory: Cognition and memory normal.        Judgment: Judgment normal.     Assessment  Plan  Snoring - Plan: Ambulatory referral to Pulmonology  Chronic low back pain, unspecified back pain laterality, unspecified whether sciatica present - Plan: traMADol (ULTRAM) 50 MG tablet  Chronic neck pain - Plan: traMADol (ULTRAM) 50 MG tablet  Chronic kidney disease, stage 3a - Plan: Comprehensive metabolic panel, Urinalysis, Routine w reflex microscopic  Hypertension associated with diabetes ([place]) - Plan: Comprehensive metabolic panel, Lipid panel, CBC with Differential/Platelet, Hemoglobin A1c Foot exam today  Cont meds   HM Flu shot utd9 /24/20 tdap utd shingrix had 01/07/17 and 06/23/17 No proof of prevnar or pna 23discuss in future MMR, hep B immune  Hep C neg covid vx 2/2 pfizer     Mammogram neg 12/08/18 ordered  colonoscopy 06/27/15 [place] [facility] small polyp sessile tubular f/u in 3 yearsto 5 years Never smoker  S/p hysterectomy DUB ovaries intact no  h/o abnormal pap DEXA sch 04/11/2018+osteopenia T score -2.1 rec calcium and vitamin Don 40K IU qd  [employer] [place] eye exam no DM retinopathy f/u in 1 year 06/01/2018 referred [facility]  Provider: Dr. Olivia Mackie McLean-[facility]oc[facility]

## 2019-10-23 ENCOUNTER — Ambulatory Visit (INDEPENDENT_AMBULATORY_CARE_PROVIDER_SITE_OTHER): Payer: Medicare Other

## 2019-10-23 VITALS — Ht 64.0 in | Wt 159.0 lb

## 2019-10-23 DIAGNOSIS — Z0000 Encounter for general adult medical examination without abnormal findings: Secondary | ICD-10-CM | POA: Diagnosis not present

## 2019-10-23 NOTE — Patient Instructions (Addendum)
Ms. Patricia Curtis , Thank you for taking time to come for your Medicare Wellness Visit. I appreciate your ongoing commitment to your health goals. Please review the following plan we discussed and let me know if I can assist you in the future.   These are the goals we discussed: Goals      Patient Stated   .  Increase physical activity (pt-stated)      Walk more for exercise and lose weight.  Weight goal 145-150lb      Other   .  Follow up with Primary Care Provider      As needed       This is a list of the screening recommended for you and due dates:  Health Maintenance  Topic Date Due  . Pneumonia vaccines (1 of 2 - PCV13) Never done  . Flu Shot  09/02/2019  . Hemoglobin A1C  11/24/2019  . Eye exam for diabetics  06/12/2020  . Complete foot exam   09/13/2020  . Mammogram  12/07/2020  . Colon Cancer Screening  06/26/2025  . Tetanus Vaccine  01/08/2029  . DEXA scan (bone density measurement)  Completed  . COVID-19 Vaccine  Completed  .  Hepatitis C: One time screening is recommended by Center for Disease Control  (CDC) for  adults born from [birth year] through 1963.   Completed    Immunizations Immunization History  Administered Date(s) Administered  . Influenza Whole 12/02/2008  . Influenza, High Dose Seasonal PF 11/03/2017, 10/28/2018  . Influenza-Unspecified 11/20/2016  . PFIZER SARS-COV-2 Vaccination 03/03/2019, 03/24/2019  . Td 01/02/2009  . Tdap 01/09/2019  . Zoster Recombinat (Shingrix) 01/08/2017, 06/23/2017   Advanced directives: End of life planning; Advance aging; Advanced directives discussed.  Copy of current HCPOA/Living Will requested.    Conditions/risks identified: none new.   Follow up in one year for your annual wellness visit.   Preventive Care 83 Years and Older, Female Preventive care refers to lifestyle choices and visits with your health care provider that can promote health and wellness. What does preventive care include?  A yearly physical exam.  This is also called an annual well check.  Dental exams once or twice a year.  Routine eye exams. Ask your health care provider how often you should have your eyes checked.  Personal lifestyle choices, including:  Daily care of your teeth and gums.  Regular physical activity.  Eating a healthy diet.  Avoiding tobacco and drug use.  Limiting alcohol use.  Practicing safe sex.  Taking low-dose aspirin every day.  Taking vitamin and mineral supplements as recommended by your health care provider. What happens during an annual well check? The services and screenings done by your health care provider during your annual well check will depend on your age, overall health, lifestyle risk factors, and family history of disease. Counseling  Your health care provider may ask you questions about your:  Alcohol use.  Tobacco use.  Drug use.  Emotional well-being.  Home and relationship well-being.  Sexual activity.  Eating habits.  History of falls.  Memory and ability to understand (cognition).  Work and work Statistician.  Reproductive health. Screening  You may have the following tests or measurements:  Height, weight, and BMI.  Blood pressure.  Lipid and cholesterol levels. These may be checked every 5 years, or more frequently if you are over 21 years old.  Skin check.  Lung cancer screening. You may have this screening every year starting at age 23 if you have  a 30-pack-year history of smoking and currently smoke or have quit within the past 15 years.  Fecal occult blood test (FOBT) of the stool. You may have this test every year starting at age 60.  Flexible sigmoidoscopy or colonoscopy. You may have a sigmoidoscopy every 5 years or a colonoscopy every 10 years starting at age 15.  Hepatitis C blood test.  Hepatitis B blood test.  Sexually transmitted disease (STD) testing.  Diabetes screening. This is done by checking your blood sugar (glucose) after  you have not eaten for a while (fasting). You may have this done every 1-3 years.  Bone density scan. This is done to screen for osteoporosis. You may have this done starting at age 44.  Mammogram. This may be done every 1-2 years. Talk to your health care provider about how often you should have regular mammograms. Talk with your health care provider about your test results, treatment options, and if necessary, the need for more tests. Vaccines  Your health care provider may recommend certain vaccines, such as:  Influenza vaccine. This is recommended every year.  Tetanus, diphtheria, and acellular pertussis (Tdap, Td) vaccine. You may need a Td booster every 10 years.  Zoster vaccine. You may need this after age 96.  Pneumococcal 13-valent conjugate (PCV13) vaccine. One dose is recommended after age 51.  Pneumococcal polysaccharide (PPSV23) vaccine. One dose is recommended after age 65. Talk to your health care provider about which screenings and vaccines you need and how often you need them. This information is not intended to replace advice given to you by your health care provider. Make sure you discuss any questions you have with your health care provider. Document Released: 02/14/2015 Document Revised: 10/08/2015 Document Reviewed: 11/19/2014 Elsevier Interactive Patient Education  2017 [employer] Prevention in the Home Falls can cause injuries. They can happen to people of all ages. There are many things you can do to make your home safe and to help prevent falls. What can I do on the outside of my home?  Regularly fix the edges of walkways and driveways and fix any cracks.  Remove anything that might make you trip as you walk through a door, such as a raised step or threshold.  Trim any bushes or trees on the path to your home.  Use bright outdoor lighting.  Clear any walking paths of anything that might make someone trip, such as rocks or tools.  Regularly  check to see if handrails are loose or broken. Make sure that both sides of any steps have handrails.  Any raised decks and porches should have guardrails on the edges.  Have any leaves, snow, or ice cleared regularly.  Use sand or salt on walking paths during winter.  Clean up any spills in your garage right away. This includes oil or grease spills. What can I do in the bathroom?  Use night lights.  Install grab bars by the toilet and in the tub and shower. Do not use towel bars as grab bars.  Use non-skid mats or decals in the tub or shower.  If you need to sit down in the shower, use a plastic, non-slip stool.  Keep the floor dry. Clean up any water that spills on the floor as soon as it happens.  Remove soap buildup in the tub or shower regularly.  Attach bath mats securely with double-sided non-slip rug tape.  Do not have throw rugs and other things on the floor that can make  you trip. What can I do in the bedroom?  Use night lights.  Make sure that you have a light by your bed that is easy to reach.  Do not use any sheets or blankets that are too big for your bed. They should not hang down onto the floor.  Have a firm chair that has side arms. You can use this for support while you get dressed.  Do not have throw rugs and other things on the floor that can make you trip. What can I do in the kitchen?  Clean up any spills right away.  Avoid walking on wet floors.  Keep items that you use a lot in easy-to-reach places.  If you need to reach something above you, use a strong step stool that has a grab bar.  Keep electrical cords out of the way.  Do not use floor polish or wax that makes floors slippery. If you must use wax, use non-skid floor wax.  Do not have throw rugs and other things on the floor that can make you trip. What can I do with my stairs?  Do not leave any items on the stairs.  Make sure that there are handrails on both sides of the stairs and  use them. Fix handrails that are broken or loose. Make sure that handrails are as long as the stairways.  Check any carpeting to make sure that it is firmly attached to the stairs. Fix any carpet that is loose or worn.  Avoid having throw rugs at the top or bottom of the stairs. If you do have throw rugs, attach them to the floor with carpet tape.  Make sure that you have a light switch at the top of the stairs and the bottom of the stairs. If you do not have them, ask someone to add them for you. What else can I do to help prevent falls?  Wear shoes that:  Do not have high heels.  Have rubber bottoms.  Are comfortable and fit you well.  Are closed at the toe. Do not wear sandals.  If you use a stepladder:  Make sure that it is fully opened. Do not climb a closed stepladder.  Make sure that both sides of the stepladder are locked into place.  Ask someone to hold it for you, if possible.  Clearly mark and make sure that you can see:  Any grab bars or handrails.  First and last steps.  Where the edge of each step is.  Use tools that help you move around (mobility aids) if they are needed. These include:  Canes.  Walkers.  Scooters.  Crutches.  Turn on the lights when you go into a dark area. Replace any light bulbs as soon as they burn out.  Set up your furniture so you have a clear path. Avoid moving your furniture around.  If any of your floors are uneven, fix them.  If there are any pets around you, be aware of where they are.  Review your medicines with your doctor. Some medicines can make you feel dizzy. This can increase your chance of falling. Ask your doctor what other things that you can do to help prevent falls. This information is not intended to replace advice given to you by your health care provider. Make sure you discuss any questions you have with your health care provider. Document Released: 11/14/2008 Document Revised: 06/26/2015 Document  Reviewed: 02/22/2014 Elsevier Interactive Patient Education  2017 [employer].

## 2019-10-23 NOTE — Progress Notes (Signed)
 Subjective:   Janelie Bednarz is a 74 y.o. female who presents for Medicare Annual (Subsequent) preventive examination.  Review of Systems    No ROS.  Medicare Wellness Virtual Visit.     Objective:    Today's Vitals   10/23/19 1338  Weight: 159 lb (72.1 kg)  Height: 5' 4" (1.626 m)   Body mass index is 27.29 kg/m.  Advanced Directives 10/23/2019 10/20/2018 02/07/2017  Does Patient Have a Medical Advance Directive? Yes No No  Type of Advance Directive Healthcare Power of Attorney;Living will - -  Does patient want to make changes to medical advance directive? No - Patient declined - -  Copy of Healthcare Power of Attorney in Chart? No - copy requested - -  Would patient like information on creating a medical advance directive? - Yes (MAU/Ambulatory/Procedural Areas - Information given) No - Patient declined    Current Medications (verified) Outpatient Encounter Medications as of 10/23/2019  Medication Sig  . aspirin EC 81 MG tablet Take by mouth daily.   . baclofen (LIORESAL) 10 MG tablet Take 1 tablet (10 mg total) by mouth at bedtime as needed for muscle spasms.  . calcium carbonate (OSCAL) 1500 (600 Ca) MG TABS tablet Take 600 mg of elemental calcium by mouth 2 (two) times daily with a meal.  . cycloSPORINE (RESTASIS) 0.05 % ophthalmic emulsion 1 drop 2 (two) times daily.  . DULoxetine (CYMBALTA) 30 MG capsule Take 1 capsule (30 mg total) by mouth daily.  . furosemide (LASIX) 20 MG tablet Take 0.5 tablets (10 mg total) by mouth daily as needed. Prn  . losartan (COZAAR) 50 MG tablet Take 1 tablet (50 mg total) by mouth daily.  . Melatonin 3 MG TABS Take by mouth daily.   . rosuvastatin (CRESTOR) 40 MG tablet Take 1 tablet (40 mg total) by mouth daily.  . traMADol (ULTRAM) 50 MG tablet Take 1 tablet (50 mg total) by mouth every 12 (twelve) hours as needed.  . valACYclovir (VALTREX) 1000 MG tablet Take 1 tablet (1,000 mg total) by mouth 2 (two) times daily. X 7-10 days outbreak  .  Ascorbic Acid (VITAMIN C) 1000 MG tablet Take 1,000 mg by mouth daily. (Patient not taking: Reported on 10/23/2019)  . Cholecalciferol (VITAMIN D-3) 125 MCG (5000 UT) TABS Take by mouth. (Patient not taking: Reported on 10/23/2019)   No facility-administered encounter medications on file as of 10/23/2019.    Allergies (verified) Lisinopril   History: Past Medical History:  Diagnosis Date  . Arthritis   . Family history of heart attack   . Herpes   . Hyperlipidemia   . Hypertension   . Kidney disease    not specified; [facility]   . Prediabetes   . Syncope    2/2 neck and back pain in the past  . Vitamin D deficiency    Past Surgical History:  Procedure Laterality Date  . ABDOMINAL HYSTERECTOMY     age 31 y.o ? reason per pt DUB no h/o abnormal pap ovaries intact   . APPENDECTOMY     1963  . BREAST CYST ASPIRATION Right 1994  . BREAST SURGERY     bx right breast 2001 benign   . cataract surgery     08/27/19 [facility]; right Dr. Semchyshyn   Family History  Problem Relation Age of Onset  . Heart disease Mother        MI  . Cancer Father        prostate to   bone  . Early death Brother   . Heart disease Brother        MI  . Stroke Brother   . Miscarriages / Stillbirths Daughter   . Hyperlipidemia Son   . Heart disease Maternal Grandfather   . Cancer Brother        multiple myeloma  . Heart disease Brother        MI  . Hyperlipidemia Brother   . Hypertension Brother   . Kidney cancer Brother   . Skin cancer Brother   . Hypertension Brother   . Hyperlipidemia Brother   . Heart disease Brother        MI  . Cancer Brother        prostate   . Breast cancer Neg Hx    Social History   Socioeconomic History  . Marital status: Married    Spouse name: Not on file  . Number of children: Not on file  . Years of education: Not on file  . Highest education level: Not on file  Occupational History  . Not on file  Tobacco Use  . Smoking status: Never  Smoker  . Smokeless tobacco: Never Used  Substance and Sexual Activity  . Alcohol use: Not Currently  . Drug use: Not Currently  . Sexual activity: Not Currently    Partners: Male  Other Topics Concern  . Not on file  Social History Narrative   Married    2 kids son and daughter    HS ed    Accounting, retired    No guns    Wears seat belt    Safe in relationship    Social Determinants of Health   Financial Resource Strain:   . Difficulty of Paying Living Expenses: Not on file  Food Insecurity: No Food Insecurity  . Worried About Running Out of Food in the Last Year: Never true  . Ran Out of Food in the Last Year: Never true  Transportation Needs: No Transportation Needs  . Lack of Transportation (Medical): No  . Lack of Transportation (Non-Medical): No  Physical Activity:   . Days of Exercise per Week: Not on file  . Minutes of Exercise per Session: Not on file  Stress: No Stress Concern Present  . Feeling of Stress : Not at all  Social Connections: Unknown  . Frequency of Communication with Friends and Family: Not on file  . Frequency of Social Gatherings with Friends and Family: Not on file  . Attends Religious Services: Not on file  . Active Member of Clubs or Organizations: Not on file  . Attends Club or Organization Meetings: Not on file  . Marital Status: Married    Tobacco Counseling Counseling given: Not Answered   Clinical Intake:  Pre-visit preparation completed: Yes        Diabetes: Yes (Followed by pcp)  How often do you need to have someone help you when you read instructions, pamphlets, or other written materials from your doctor or pharmacy?: 1 - Never  Interpreter Needed?: No      Activities of Daily Living In your present state of health, do you have any difficulty performing the following activities: 10/23/2019  Hearing? N  Vision? N  Difficulty concentrating or making decisions? N  Walking or climbing stairs? N  Dressing or  bathing? N  Doing errands, shopping? N  Preparing Food and eating ? N  Using the Toilet? N  In the past six months, have you accidently leaked urine?   N  Do you have problems with loss of bowel control? N  Managing your Medications? N  Managing your Finances? N  Housekeeping or managing your Housekeeping? N  Some recent data might be hidden    Patient Care Team: McLean-Scocuzza, Nino Glow, MD as PCP - General (Internal Medicine)  Indicate any recent Medical Services you may have received from other than [facility] providers in the past year (date may be approximate).     Assessment:   This is a routine wellness examination for Takiah.  I connected with Kevin today by telephone and verified that I am speaking with the correct person using two identifiers. Location patient: home Location provider: work Persons participating in the virtual visit: patient, Marine scientist.    I discussed the limitations, risks, security and privacy concerns of performing an evaluation and management service by telephone and the availability of in person appointments. The patient expressed understanding and verbally consented to this telephonic visit.    Interactive audio and video telecommunications were attempted between this provider and patient, however failed, due to patient having technical difficulties OR patient did not have access to video capability.  We continued and completed visit with audio only.  Some vital signs may be absent or patient reported.   Hearing/Vision screen  Hearing Screening   125Hz 250Hz 500Hz 1000Hz 2000Hz 3000Hz 4000Hz 6000Hz 8000Hz  Right ear:           Left ear:           Comments: Patient is able to hear conversational tones without difficulty.  No issues reported.  Vision Screening Comments: Wears corrective lenses  Visual acuity not assessed, virtual visit.  They have seen their ophthalmologist in the last 12 months  Dietary issues and exercise activities discussed: Current  Exercise Habits: Home exercise routine, Type of exercise: walking, Intensity: Mild  Goals      Patient Stated   .  Increase physical activity (pt-stated)      Walk more for exercise and lose weight.  Weight goal 145-150lb      Other   .  Follow up with Primary Care Provider      As needed      Depression Screen PHQ 2/9 Scores 10/23/2019 09/14/2019 03/22/2019 10/20/2018 07/11/2018 03/10/2018 11/03/2017  PHQ - 2 Score 0 0 0 0 0 0 0    Fall Risk Fall Risk  10/23/2019 09/14/2019 03/22/2019 10/20/2018 07/11/2018  Falls in the past year? 0 0 0 0 0  Number falls in past yr: 0 0 0 - -  Injury with Fall? 0 0 0 - -  Follow up Falls evaluation completed Falls evaluation completed Falls evaluation completed - -   Handrails in use when climbing stairs? Yes Home free of loose throw rugs in walkways, pet beds, electrical cords, etc? Yes  Adequate lighting in your home to reduce risk of falls? Yes   ASSISTIVE DEVICES UTILIZED TO PREVENT FALLS: Use of a cane, walker or w/c? No   TIMED UP AND GO: Was the test performed? No .  Virtual visit.    Cognitive Function:  Patient is alert and oriented x3.  Denies difficulty making decisions, memory loss, focusing.  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   6CIT Screen 10/20/2018  What Year? 0 points  What month? 0 points  What time? 0 points  Count back from 20 0 points  Months in reverse 0 points  Repeat phrase 0 points  Total Score 0  Immunizations Immunization History  Administered Date(s) Administered  . Influenza Whole 12/02/2008  . Influenza, High Dose Seasonal PF 11/03/2017, 10/28/2018  . Influenza-Unspecified 11/20/2016  . PFIZER SARS-COV-2 Vaccination 03/03/2019, 03/24/2019  . Td 01/02/2009  . Tdap 01/09/2019  . Zoster Recombinat (Shingrix) 01/08/2017, 06/23/2017    Health Maintenance Health Maintenance  Topic Date Due  . PNA vac Low Risk Adult (1 of 2 - PCV13) Never done  . INFLUENZA VACCINE  09/02/2019  .  HEMOGLOBIN A1C  11/24/2019  . OPHTHALMOLOGY EXAM  06/12/2020  . FOOT EXAM  09/13/2020  . MAMMOGRAM  12/07/2020  . COLONOSCOPY  06/26/2025  . TETANUS/TDAP  01/08/2029  . DEXA SCAN  Completed  . COVID-19 Vaccine  Completed  . Hepatitis C Screening  Completed    Dental Screening: Recommended annual dental exams for proper oral hygiene.  Community Resource Referral / Chronic Care Management: CRR required this visit?  No   CCM required this visit?  No      Plan:   Keep all routine maintenance appointments.   Next scheduled lab10/28/21 @ 8:15  Follow up 03/22/19 @ 9:00  I have personally reviewed and noted the following in the patient's chart:   . Medical and social history . Use of alcohol, tobacco or illicit drugs  . Current medications and supplements . Functional ability and status . Nutritional status . Physical activity . Advanced directives . List of other physicians . Hospitalizations, surgeries, and ER visits in previous 12 months . Vitals . Screenings to include cognitive, depression, and falls . Referrals and appointments  In addition, I have reviewed and discussed with patient certain preventive protocols, quality metrics, and best practice recommendations. A written personalized care plan for preventive services as well as general preventive health recommendations were provided to patient via mychart.     OBrien-Blaney, Denisa L, LPN   10/23/2019        

## 2019-10-24 ENCOUNTER — Encounter: Payer: Self-pay | Admitting: Internal Medicine

## 2019-11-13 ENCOUNTER — Encounter: Payer: Self-pay | Admitting: Internal Medicine

## 2019-11-13 DIAGNOSIS — Z23 Encounter for immunization: Secondary | ICD-10-CM | POA: Diagnosis not present

## 2019-11-26 ENCOUNTER — Other Ambulatory Visit: Payer: Medicare Other

## 2019-11-26 DIAGNOSIS — Z87891 Personal history of nicotine dependence: Secondary | ICD-10-CM | POA: Diagnosis not present

## 2019-11-26 DIAGNOSIS — E785 Hyperlipidemia, unspecified: Secondary | ICD-10-CM | POA: Diagnosis not present

## 2019-11-26 DIAGNOSIS — Z888 Allergy status to other drugs, medicaments and biological substances status: Secondary | ICD-10-CM | POA: Diagnosis not present

## 2019-11-26 DIAGNOSIS — Z79890 Hormone replacement therapy (postmenopausal): Secondary | ICD-10-CM | POA: Diagnosis not present

## 2019-11-26 DIAGNOSIS — Z7982 Long term (current) use of aspirin: Secondary | ICD-10-CM | POA: Diagnosis not present

## 2019-11-26 DIAGNOSIS — Z9841 Cataract extraction status, right eye: Secondary | ICD-10-CM | POA: Diagnosis not present

## 2019-11-26 DIAGNOSIS — I10 Essential (primary) hypertension: Secondary | ICD-10-CM | POA: Diagnosis not present

## 2019-11-26 DIAGNOSIS — E1136 Type 2 diabetes mellitus with diabetic cataract: Secondary | ICD-10-CM | POA: Diagnosis not present

## 2019-11-26 DIAGNOSIS — H2512 Age-related nuclear cataract, left eye: Secondary | ICD-10-CM | POA: Diagnosis not present

## 2019-11-26 DIAGNOSIS — Z79899 Other long term (current) drug therapy: Secondary | ICD-10-CM | POA: Diagnosis not present

## 2019-11-29 ENCOUNTER — Other Ambulatory Visit (INDEPENDENT_AMBULATORY_CARE_PROVIDER_SITE_OTHER): Payer: Medicare Other

## 2019-11-29 ENCOUNTER — Other Ambulatory Visit: Payer: Self-pay

## 2019-11-29 DIAGNOSIS — Z23 Encounter for immunization: Secondary | ICD-10-CM | POA: Diagnosis not present

## 2019-11-29 DIAGNOSIS — N1831 Chronic kidney disease, stage 3a: Secondary | ICD-10-CM

## 2019-11-29 DIAGNOSIS — Z1329 Encounter for screening for other suspected endocrine disorder: Secondary | ICD-10-CM | POA: Diagnosis not present

## 2019-11-29 DIAGNOSIS — Z1389 Encounter for screening for other disorder: Secondary | ICD-10-CM

## 2019-11-29 DIAGNOSIS — E1159 Type 2 diabetes mellitus with other circulatory complications: Secondary | ICD-10-CM

## 2019-11-29 DIAGNOSIS — I152 Hypertension secondary to endocrine disorders: Secondary | ICD-10-CM | POA: Diagnosis not present

## 2019-11-29 LAB — COMPREHENSIVE METABOLIC PANEL
ALT: 20 U/L (ref 0–35)
AST: 22 U/L (ref 0–37)
Albumin: 4.2 g/dL (ref 3.5–5.2)
Alkaline Phosphatase: 47 U/L (ref 39–117)
BUN: 19 mg/dL (ref 6–23)
CO2: 30 mEq/L (ref 19–32)
Calcium: 9.8 mg/dL (ref 8.4–10.5)
Chloride: 102 mEq/L (ref 96–112)
Creatinine, Ser: 1 mg/dL (ref 0.40–1.20)
GFR: 56.18 mL/min — ABNORMAL LOW (ref >60.00)
Glucose, Bld: 105 mg/dL — ABNORMAL HIGH (ref 70–99)
Potassium: 4.7 mEq/L (ref 3.5–5.1)
Sodium: 140 mEq/L (ref 135–145)
Total Bilirubin: 0.4 mg/dL (ref 0.2–1.2)
Total Protein: 6.7 g/dL (ref 6.0–8.3)

## 2019-11-29 LAB — LIPID PANEL
Cholesterol: 151 mg/dL (ref 0–200)
HDL: 72.5 mg/dL (ref >39.00)
LDL Cholesterol: 62 mg/dL (ref 0–99)
NonHDL: 78.25
Total CHOL/HDL Ratio: 2
Triglycerides: 82 mg/dL (ref 0.0–149.0)
VLDL: 16.4 mg/dL (ref 0.0–40.0)

## 2019-11-29 LAB — CBC WITH DIFFERENTIAL/PLATELET
Basophils Absolute: 0.1 10*3/uL (ref 0.0–0.1)
Basophils Relative: 1.1 % (ref 0.0–3.0)
Eosinophils Absolute: 0.2 10*3/uL (ref 0.0–0.7)
Eosinophils Relative: 3.4 % (ref 0.0–5.0)
HCT: 39.8 % (ref 36.0–46.0)
Hemoglobin: 13.3 g/dL (ref 12.0–15.0)
Lymphocytes Relative: 37.4 % (ref 12.0–46.0)
Lymphs Abs: 2.1 10*3/uL (ref 0.7–4.0)
MCHC: 33.5 g/dL (ref 30.0–36.0)
MCV: 85.4 fl (ref 78.0–100.0)
Monocytes Absolute: 0.4 10*3/uL (ref 0.1–1.0)
Monocytes Relative: 7.7 % (ref 3.0–12.0)
Neutro Abs: 2.8 10*3/uL (ref 1.4–7.7)
Neutrophils Relative %: 50.4 % (ref 43.0–77.0)
Platelets: 228 10*3/uL (ref 150.0–400.0)
RBC: 4.66 Mil/uL (ref 3.87–5.11)
RDW: 14 % (ref 11.5–15.5)
WBC: 5.6 10*3/uL (ref 4.0–10.5)

## 2019-11-29 LAB — TSH: TSH: 3.7 u[IU]/mL (ref 0.35–4.50)

## 2019-11-29 LAB — HEMOGLOBIN A1C: Hgb A1c MFr Bld: 6.8 % — ABNORMAL HIGH (ref 4.6–6.5)

## 2019-11-30 LAB — URINALYSIS, ROUTINE W REFLEX MICROSCOPIC
Bacteria, UA: NONE SEEN /HPF
Bilirubin Urine: NEGATIVE
Glucose, UA: NEGATIVE
Hgb urine dipstick: NEGATIVE
Hyaline Cast: NONE SEEN /LPF
Ketones, ur: NEGATIVE
Nitrite: NEGATIVE
Protein, ur: NEGATIVE
Specific Gravity, Urine: 1.017 (ref 1.001–1.03)
pH: 6.5 (ref 5.0–8.0)

## 2019-12-14 ENCOUNTER — Other Ambulatory Visit: Payer: Self-pay

## 2019-12-14 ENCOUNTER — Ambulatory Visit
Admission: RE | Admit: 2019-12-14 | Discharge: 2019-12-14 | Disposition: A | Discharge status: Home / Self Care | Payer: Medicare Other | Source: Ambulatory Visit | Attending: Internal Medicine | Admitting: Internal Medicine

## 2019-12-14 DIAGNOSIS — Z1231 Encounter for screening mammogram for malignant neoplasm of breast: Secondary | ICD-10-CM | POA: Insufficient documentation

## 2019-12-24 ENCOUNTER — Encounter: Payer: Self-pay | Admitting: Primary Care

## 2019-12-24 ENCOUNTER — Other Ambulatory Visit: Payer: Self-pay

## 2019-12-24 ENCOUNTER — Ambulatory Visit (INDEPENDENT_AMBULATORY_CARE_PROVIDER_SITE_OTHER): Payer: Medicare Other | Admitting: Primary Care

## 2019-12-24 DIAGNOSIS — R0683 Snoring: Secondary | ICD-10-CM | POA: Diagnosis not present

## 2019-12-24 NOTE — Progress Notes (Signed)
@Patient  ID: [name], female    DOB: 05-14-1945, 74 y.o.   MRN: [id number]  Chief Complaint  Patient presents with  . Sleep consult    per Dr. Aundra Dubin-- no prior sleep study. c/o loud snoring and restless sleep x3-4y    Referring provider: McLean-Scocuzza, Olivia Mackie *  HPI: 74 year old female, never smoked.  She significant for hypertension, type 2 diabetes, chronic kidney disease stage III, osteopenia, hyperlipidemia, abdominal hysterectomy, appendectomy, overweight.  Patient referred for sleep consult by Dr. Terese Door.   12/24/2019 Patient presents today for sleep consult. Reports symptoms of snoring, restless sleep and daytime fatigue for the last 3-4 years. She still works part time 27 hours a week from home. It takes her at least an hour to fall asleep every night. She falls asleep with television on. She takes Melatonin 6mg  every night. She has tried tylenol PM but experienced residual grogginess the next day. She does not drink alcohol. She gets 5 hours of sleep a night. Her brother has sleep apnea.  Sleep questionnaire Prior sleep study- None Symptoms- Snoring, restless sleep, daytime fatigue Bedtime - 12am  Time to fall asleep- 1-2 hours  Wake time- 5-7am  Nocturnal awakening- 3-4 times Oxygen- None  Epworth- 6   Allergies  Allergen Reactions  . Lisinopril     Cough     Immunization History  Administered Date(s) Administered  . Fluad Quad(high Dose 65+) 11/29/2019  . Influenza Whole 12/02/2008  . Influenza, High Dose Seasonal PF 11/03/2017, 10/28/2018  . Influenza-Unspecified 11/20/2016  . PFIZER SARS-COV-2 Vaccination 03/03/2019, 03/24/2019, 11/13/2019  . Td 01/02/2009  . Tdap 01/09/2019  . Zoster Recombinat (Shingrix) 01/08/2017, 06/23/2017    Past Medical History:  Diagnosis Date  . Arthritis   . Family history of heart attack   . Herpes   . Hyperlipidemia   . Hypertension   . Kidney disease    not specified; [facility]   .  Prediabetes   . Syncope    2/2 neck and back pain in the past  . Vitamin D deficiency     Tobacco History: Social History   Tobacco Use  Smoking Status Never Smoker  Smokeless Tobacco Never Used   Counseling given: Not Answered   Outpatient Medications Prior to Visit  Medication Sig Dispense Refill  . Ascorbic Acid (VITAMIN C) 1000 MG tablet Take 1,000 mg by mouth daily.     Marland Kitchen aspirin EC 81 MG tablet Take by mouth daily.     . baclofen (LIORESAL) 10 MG tablet Take 1 tablet (10 mg total) by mouth at bedtime as needed for muscle spasms. 90 each 3  . calcium carbonate (OSCAL) 1500 (600 Ca) MG TABS tablet Take 600 mg of elemental calcium by mouth 2 (two) times daily with a meal.    . Cholecalciferol (VITAMIN D-3) 125 MCG (5000 UT) TABS Take by mouth.     . DULoxetine (CYMBALTA) 30 MG capsule Take 1 capsule (30 mg total) by mouth daily. 90 capsule 3  . furosemide (LASIX) 20 MG tablet Take 0.5 tablets (10 mg total) by mouth daily as needed. Prn 45 tablet 11  . losartan (COZAAR) 50 MG tablet Take 1 tablet (50 mg total) by mouth daily. 90 tablet 3  . Melatonin 3 MG TABS Take by mouth daily.     . rosuvastatin (CRESTOR) 40 MG tablet Take 1 tablet (40 mg total) by mouth daily. 90 tablet 3  . traMADol (ULTRAM) 50 MG tablet Take 1 tablet (50 mg total)  by mouth every 12 (twelve) hours as needed. 60 tablet 2  . valACYclovir (VALTREX) 1000 MG tablet Take 1 tablet (1,000 mg total) by mouth 2 (two) times daily. X 7-10 days outbreak 30 tablet 12  . cycloSPORINE (RESTASIS) 0.05 % ophthalmic emulsion 1 drop 2 (two) times daily.     No facility-administered medications prior to visit.   Review of Systems  Review of Systems  Constitutional: Negative.   Respiratory: Negative.    Physical Exam  BP 130/76 (BP Location: Left Arm, Cuff Size: Normal)   Pulse 74   Temp 97.8 F (36.6 C) (Temporal)   Ht 5\' 4"  (1.626 m)   Wt 159 lb 6.4 oz (72.3 kg)   SpO2 98%   BMI 27.36 kg/m  Physical  Exam Constitutional:      Appearance: Normal appearance.  HENT:     Head: Normocephalic and atraumatic.     Mouth/Throat:     Mouth: Mucous membranes are moist.     Pharynx: Oropharynx is clear.  Cardiovascular:     Rate and Rhythm: Normal rate and regular rhythm.  Pulmonary:     Effort: Pulmonary effort is normal.     Breath sounds: Normal breath sounds.  Skin:    General: Skin is warm and dry.  Neurological:     General: No focal deficit present.     Mental Status: She is alert and oriented to person, place, and time. Mental status is at baseline.  Psychiatric:        Mood and Affect: Mood normal.        Behavior: Behavior normal.        Thought Content: Thought content normal.        Judgment: Judgment normal.      Lab Results:  CBC    Component Value Date/Time   WBC 5.6 11/29/2019 0802   RBC 4.66 11/29/2019 0802   HGB 13.3 11/29/2019 0802   HCT 39.8 11/29/2019 0802   PLT 228.0 11/29/2019 0802   MCV 85.4 11/29/2019 0802   MCHC 33.5 11/29/2019 0802   RDW 14.0 11/29/2019 0802   LYMPHSABS 2.1 11/29/2019 0802   MONOABS 0.4 11/29/2019 0802   EOSABS 0.2 11/29/2019 0802   BASOSABS 0.1 11/29/2019 0802    BMET    Component Value Date/Time   NA 140 11/29/2019 0802   K 4.7 11/29/2019 0802   CL 102 11/29/2019 0802   CO2 30 11/29/2019 0802   GLUCOSE 105 (H) 11/29/2019 0802   BUN 19 11/29/2019 0802   CREATININE 1.00 11/29/2019 0802   CALCIUM 9.8 11/29/2019 0802   GFRNONAA 59.77 11/13/2008 0938   GFRAA 82 12/12/2007 1107    BNP No results found for: BNP  ProBNP No results found for: PROBNP  Imaging: MM 3D SCREEN BREAST BILATERAL  Result Date: 12/18/2019 CLINICAL DATA:  Screening. EXAM: DIGITAL SCREENING BILATERAL MAMMOGRAM WITH TOMO AND CAD COMPARISON:  Previous exam(s). ACR Breast Density Category c: The breast tissue is heterogeneously dense, which may obscure small masses. FINDINGS: There are no findings suspicious for malignancy. Images were processed  with CAD. IMPRESSION: No mammographic evidence of malignancy. A result letter of this screening mammogram will be mailed directly to the patient. RECOMMENDATION: Screening mammogram in one year. (Code:SM-B-01Y) BI-RADS CATEGORY  1: Negative. Electronically Signed   By: Ammie Ferrier M.D.   On: 12/18/2019 10:20     Assessment & Plan:   Snoring - Patient reports symptoms of snoring, restless sleep and daytime fatigue for several years. It  takes her on average 1-2 hours to fall asleep at night. Epworth score 6/24. Concern she could have underlying sleep apnea. We will order a home sleep study to evaluate. Discussed risk of untreated sleep apnea including cardiovascular disease, cardiac arrhythmias, HTN, stroke, diabetes and increase in motor vehicle accidents. We also discussed treatment options for sleep apnea such as weight loss, side sleeping position, oral appliance, CPAP and referral to ENT for possible surgical consideration. Follow-up in 6 weeks to review sleep study.   Recommendations: - Encourage regular sleep routine/schedule  - Eliminate blue light at night if possible (use timer on television)   Orders: - Home sleep study re: snoring  Follow-up: - 6 week televisit with Beth NP to review sleep study results     Martyn Ehrich, NP 12/25/2019

## 2019-12-24 NOTE — Patient Instructions (Addendum)
Nice meeting you today  Recommendations: - Encourage regular sleep routine/schedule  - Eliminate blue light at night if possible (use timer on television)   Orders: - Home sleep study re: snoring  Follow-up: - 6 week televisit with Beth NP to review sleep study results    Sleep Apnea Sleep apnea affects breathing during sleep. It causes breathing to stop for a short time or to become shallow. It can also increase the risk of:  Heart attack.  Stroke.  Being very overweight (obese).  Diabetes.  Heart failure.  Irregular heartbeat. The goal of treatment is to help you breathe normally again. What are the causes? There are three kinds of sleep apnea:  Obstructive sleep apnea. This is caused by a blocked or collapsed airway.  Central sleep apnea. This happens when the brain does not send the right signals to the muscles that control breathing.  Mixed sleep apnea. This is a combination of obstructive and central sleep apnea. The most common cause of this condition is a collapsed or blocked airway. This can happen if:  Your throat muscles are too relaxed.  Your tongue and tonsils are too large.  You are overweight.  Your airway is too small. What increases the risk?  Being overweight.  Smoking.  Having a small airway.  Being older.  Being female.  Drinking alcohol.  Taking medicines to calm yourself (sedatives or tranquilizers).  Having family members with the condition. What are the signs or symptoms?  Trouble staying asleep.  Being sleepy or tired during the day.  Getting angry a lot.  Loud snoring.  Headaches in the morning.  Not being able to focus your mind (concentrate).  Forgetting things.  Less interest in sex.  Mood swings.  Personality changes.  Feelings of sadness (depression).  Waking up a lot during the night to pee (urinate).  Dry mouth.  Sore throat. How is this diagnosed?  Your medical history.  A physical  exam.  A test that is done when you are sleeping (sleep study). The test is most often done in a sleep lab but may also be done at home. How is this treated?   Sleeping on your side.  Using a medicine to get rid of mucus in your nose (decongestant).  Avoiding the use of alcohol, medicines to help you relax, or certain pain medicines (narcotics).  Losing weight, if needed.  Changing your diet.  Not smoking.  Using a machine to open your airway while you sleep, such as: ? An oral appliance. This is a mouthpiece that shifts your lower jaw forward. ? A CPAP device. This device blows air through a mask when you breathe out (exhale). ? An EPAP device. This has valves that you put in each nostril. ? A BPAP device. This device blows air through a mask when you breathe in (inhale) and breathe out.  Having surgery if other treatments do not work. It is important to get treatment for sleep apnea. Without treatment, it can lead to:  High blood pressure.  Coronary artery disease.  In men, not being able to have an erection (impotence).  Reduced thinking ability. Follow these instructions at home: Lifestyle  Make changes that your doctor recommends.  Eat a healthy diet.  Lose weight if needed.  Avoid alcohol, medicines to help you relax, and some pain medicines.  Do not use any products that contain nicotine or tobacco, such as cigarettes, e-cigarettes, and chewing tobacco. If you need help quitting, ask your doctor. General  instructions  Take over-the-counter and prescription medicines only as told by your doctor.  If you were given a machine to use while you sleep, use it only as told by your doctor.  If you are having surgery, make sure to tell your doctor you have sleep apnea. You may need to bring your device with you.  Keep all follow-up visits as told by your doctor. This is important. Contact a doctor if:  The machine that you were given to use during sleep bothers  you or does not seem to be working.  You do not get better.  You get worse. Get help right away if:  Your chest hurts.  You have trouble breathing in enough air.  You have an uncomfortable feeling in your back, arms, or stomach.  You have trouble talking.  One side of your body feels weak.  A part of your face is hanging down. These symptoms may be an emergency. Do not wait to see if the symptoms will go away. Get medical help right away. Call your local emergency services (911 in the [place] Do not drive yourself to the hospital. Summary  This condition affects breathing during sleep.  The most common cause is a collapsed or blocked airway.  The goal of treatment is to help you breathe normally while you sleep. This information is not intended to replace advice given to you by your health care provider. Make sure you discuss any questions you have with your health care provider. Document Revised: 11/04/2017 Document Reviewed: 09/13/2017 Elsevier Patient Education  [place].

## 2019-12-25 DIAGNOSIS — R0683 Snoring: Secondary | ICD-10-CM | POA: Insufficient documentation

## 2019-12-25 NOTE — Assessment & Plan Note (Addendum)
-   Patient reports symptoms of snoring, restless sleep and daytime fatigue for several years. It takes her on average 1-2 hours to fall asleep at night. Epworth score 6/24. Concern she could have underlying sleep apnea. We will order a home sleep study to evaluate. Discussed risk of untreated sleep apnea including cardiovascular disease, cardiac arrhythmias, HTN, stroke, diabetes and increase in motor vehicle accidents. We also discussed treatment options for sleep apnea such as weight loss, side sleeping position, oral appliance, CPAP and referral to ENT for possible surgical consideration. Follow-up in 6 weeks to review sleep study.   Recommendations: - Encourage regular sleep routine/schedule  - Eliminate blue light at night if possible (use timer on television)   Orders: - Home sleep study re: snoring  Follow-up: - 6 week televisit with Beth NP to review sleep study results

## 2019-12-26 NOTE — Progress Notes (Signed)
Reviewed and agree with assessment/plan.   Chesley Mires, MD [facility] Pulmonary/Critical Care 12/26/2019, 7:27 PM Pager:  [phone]

## 2020-01-08 ENCOUNTER — Telehealth: Payer: Self-pay | Admitting: Primary Care

## 2020-01-08 DIAGNOSIS — R0683 Snoring: Secondary | ICD-10-CM

## 2020-01-08 NOTE — Telephone Encounter (Signed)
I have spoken with Patricia Curtis and she has been scheduled to pick up the HST machine on 01/15/2020 @ 10:00am

## 2020-01-08 NOTE — Telephone Encounter (Signed)
Patricia Curtis when this patient was seen the home sleep test was suppose to be order but I don't see an order for Home  Sleep Test

## 2020-01-08 NOTE — Telephone Encounter (Signed)
HST has been ordered

## 2020-01-15 ENCOUNTER — Ambulatory Visit: Payer: Medicare Other

## 2020-01-15 ENCOUNTER — Other Ambulatory Visit: Payer: Self-pay

## 2020-01-15 DIAGNOSIS — G4733 Obstructive sleep apnea (adult) (pediatric): Secondary | ICD-10-CM | POA: Diagnosis not present

## 2020-01-15 DIAGNOSIS — R0683 Snoring: Secondary | ICD-10-CM

## 2020-01-18 DIAGNOSIS — G4733 Obstructive sleep apnea (adult) (pediatric): Secondary | ICD-10-CM | POA: Diagnosis not present

## 2020-01-22 ENCOUNTER — Other Ambulatory Visit: Payer: Self-pay

## 2020-01-22 ENCOUNTER — Ambulatory Visit (INDEPENDENT_AMBULATORY_CARE_PROVIDER_SITE_OTHER): Payer: Medicare Other | Admitting: Dermatology

## 2020-01-22 DIAGNOSIS — L814 Other melanin hyperpigmentation: Secondary | ICD-10-CM | POA: Diagnosis not present

## 2020-01-22 DIAGNOSIS — L578 Other skin changes due to chronic exposure to nonionizing radiation: Secondary | ICD-10-CM | POA: Diagnosis not present

## 2020-01-22 DIAGNOSIS — L720 Epidermal cyst: Secondary | ICD-10-CM | POA: Diagnosis not present

## 2020-01-22 DIAGNOSIS — Z1283 Encounter for screening for malignant neoplasm of skin: Secondary | ICD-10-CM

## 2020-01-22 DIAGNOSIS — D229 Melanocytic nevi, unspecified: Secondary | ICD-10-CM

## 2020-01-22 DIAGNOSIS — D1800 Hemangioma unspecified site: Secondary | ICD-10-CM

## 2020-01-22 DIAGNOSIS — L820 Inflamed seborrheic keratosis: Secondary | ICD-10-CM

## 2020-01-22 DIAGNOSIS — L821 Other seborrheic keratosis: Secondary | ICD-10-CM | POA: Diagnosis not present

## 2020-01-22 NOTE — Patient Instructions (Addendum)
Seborrheic Keratosis  What causes seborrheic keratoses? Seborrheic keratoses are harmless, common skin growths that first appear during adult life.  As time goes by, more growths appear.  Some people may develop a large number of them.  Seborrheic keratoses appear on both covered and uncovered body parts.  They are not caused by sunlight.  The tendency to develop seborrheic keratoses can be inherited.  They vary in color from skin-colored to gray, brown, or even black.  They can be either smooth or have a rough, warty surface.   Seborrheic keratoses are superficial and look as if they were stuck on the skin.  Under the microscope this type of keratosis looks like layers upon layers of skin.  That is why at times the top layer may seem to fall off, but the rest of the growth remains and re-grows.    Treatment Seborrheic keratoses do not need to be treated, but can easily be removed in the office.  Seborrheic keratoses often cause symptoms when they rub on clothing or jewelry.  Lesions can be in the way of shaving.  If they become inflamed, they can cause itching, soreness, or burning.  Removal of a seborrheic keratosis can be accomplished by freezing, burning, or surgery. If any spot bleeds, scabs, or grows rapidly, please return to have it checked, as these can be an indication of a skin cancer.    Pre-Operative Instructions  You are scheduled for a surgical procedure at [facility]. We recommend you read the following instructions. If you have any questions or concerns, please call the office at [phone].  1. Shower and wash the entire body with soap and water the day of your surgery paying special attention to cleansing at and around the planned surgery site.  2. Avoid aspirin or aspirin containing products at least fourteen (14) days prior to your surgical procedure and for at least one week (7 Days) after your surgical procedure. If you take aspirin on a regular basis for heart  disease or history of stroke or for any other reason, we may recommend you continue taking aspirin but please notify us if you take this on a regular basis. Aspirin can cause more bleeding to occur during surgery as well as prolonged bleeding and bruising after surgery.   3. Avoid other nonsteroidal pain medications at least one week prior to surgery and at least one week prior to your surgery. These include medications such as Ibuprofen (Motrin, Advil and Nuprin), Naprosyn, Voltaren, Relafen, etc. If medications are used for therapeutic reasons, please inform us as they can cause increased bleeding or prolonged bleeding during and bruising after surgical procedures.   4. Please advise [place] if you are taking any "blood thinner" medications such as Coumadin or Dipyridamole or Plavix or similar medications. These cause increased bleeding and prolonged bleeding during procedures and bruising after surgical procedures. We may have to consider discontinuing these medications briefly prior to and shortly after your surgery if safe to do so.   5. Please inform us of all medications you are currently taking. All medications that are taken regularly should be taken the day of surgery as you always do. Nevertheless, we need to be informed of what medications you are taking prior to surgery to know whether they will affect the procedure or cause any complications.   6. Please inform us of any medication allergies. Also inform us of whether you have allergies to Latex or rubber products or whether you have had any adverse  reaction to Lidocaine or Epinephrine.  7. Please inform us of any prosthetic or artificial body parts such as artificial heart valve, joint replacements, etc., or similar condition that might require preoperative antibiotics.   8. We recommend avoidance of alcohol at least two weeks prior to surgery and continued avoidance for at least two weeks after surgery.   9. We recommend discontinuation of  tobacco smoking at least two weeks prior to surgery and continued abstinence for at least two weeks after surgery.  10. Do not plan strenuous exercise, strenuous work or strenuous lifting for approximately four weeks after your surgery.   11. We request if you are unable to make your scheduled surgical appointment, please call us at least a week in advance or as soon as you are aware of a problem so that we can cancel or reschedule the appointment.   12. You MAY TAKE TYLENOL (acetaminophen) for pain as it is not a blood thinner.   13. PLEASE PLAN TO BE IN TOWN FOR TWO WEEKS FOLLOWING SURGERY, THIS IS IMPORTANT SO YOU CAN BE CHECKED FOR DRESSING CHANGES, SUTURE REMOVAL AND TO MONITOR FOR POSSIBLE COMPLICATIONS.  Melanoma ABCDEs  Melanoma is the most dangerous type of skin cancer, and is the leading cause of death from skin disease.  You are more likely to develop melanoma if you:  Have light-colored skin, light-colored eyes, or red or blond hair  Spend a lot of time in the sun  Tan regularly, either outdoors or in a tanning bed  Have had blistering sunburns, especially during childhood  Have a close family member who has had a melanoma  Have atypical moles or large birthmarks  Early detection of melanoma is key since treatment is typically straightforward and cure rates are extremely high if we catch it early.   The first sign of melanoma is often a change in a mole or a new dark spot.  The ABCDE system is a way of remembering the signs of melanoma.  A for asymmetry:  The two halves do not match. B for border:  The edges of the growth are irregular. C for color:  A mixture of colors are present instead of an even brown color. D for diameter:  Melanomas are usually (but not always) greater than 2mm - the size of a pencil eraser. E for evolution:  The spot keeps changing in size, shape, and color.  Please check your skin once per month between visits. You can use a small mirror in  front and a large mirror behind you to keep an eye on the back side or your body.   If you see any new or changing lesions before your next follow-up, please call to schedule a visit.  Please continue daily skin protection including broad spectrum sunscreen SPF 30+ to sun-exposed areas, reapplying every 2 hours as needed when you're outdoors.   Spot on right cheek  Discussed cosmetic procedure, noncovered.  $60 for 1st lesion and $15 for each additional lesion if done on the same day.  Maximum charge $350.  One touch-up treatment included no charge. Discussed risks of treatment including dyspigmentation, small scar, and/or recurrence.  Cryotherapy Aftercare  . Wash gently with soap and water everyday.   Marland Kitchen Apply Vaseline and Band-Aid daily until healed.

## 2020-01-22 NOTE — Progress Notes (Signed)
New Patient Visit  Subjective  Patricia Curtis is a 74 y.o. female who presents for the following: New Patient (Initial Visit) (New patient referred by Dr. Terese Door, patient is here for TBSE. Patient reports having something on left back shoulder states been present for 30 years and changed in appearance. It bothers her in appearance. Also reports some raised up spots on bilateral legs that itch occasional reports noticing them a year and half ago. Patient also has place on right cheek that she would like checked. ).  She has a lump on her upper back that gets irritated.  The patient presents for Total-Body Skin Exam (TBSE) for skin cancer screening and mole check.   Objective  Well appearing patient in no apparent distress; mood and affect are within normal limits.  A full examination was performed including scalp, head, eyes, ears, nose, lips, neck, chest, axillae, abdomen, back, buttocks, bilateral upper extremities, bilateral lower extremities, hands, feet, fingers, toes, fingernails, and toenails. All findings within normal limits unless otherwise noted below.  Objective  left posterior shoulder: 1.7 cm waxy tan plaque   Objective  Right upper calf x 4 left popliteal x 1 (5): Erythematous keratotic or waxy stuck-on papule  Objective  Left Spinal upper back: 0.7 cm firm subcutaneous nodule   Objective  right malar cheek: 2.5 x 1 cm light tan patch   Assessment & Plan  Seborrheic keratosis left posterior shoulder  Reassured benign age-related growth.  Recommend observation.  Discussed cryotherapy if spot(s) become irritated or inflamed.   Discussed cosmetic procedure to remove if asymptomatic, noncovered.  $60 for 1st lesion and $15 for each additional lesion if done on the same day.  Maximum charge $350.  One touch-up treatment included no charge. Discussed risks of treatment including dyspigmentation, small scar, and/or recurrence.   Inflamed seborrheic keratosis  (5) Right upper calf x 4 left popliteal x 1  Destruction of lesion - Right upper calf x 4 left popliteal x 1  Destruction method: cryotherapy   Informed consent: discussed and consent obtained   Lesion destroyed using liquid nitrogen: Yes   Region frozen until ice ball extended beyond lesion: Yes   Outcome: patient tolerated procedure well with no complications   Post-procedure details: wound care instructions given    Epidermal inclusion cyst Left Spinal upper back   Benign-appearing. Exam most consistent with an epidermal inclusion cyst. Discussed that a cyst is a benign growth that can grow over time and sometimes get irritated or inflamed. Recommend observation if it is not bothersome. Discussed option of surgical excision to remove it if it is growing, symptomatic, or other changes noted.  Pt states it is bothersome.   Cyst with symptoms and/or recent change.  Discussed surgical excision to remove, including resulting scar and possible recurrence.  Patient will schedule for surgery. Pre-op information given.   Lentigines right malar cheek  Vs SK, benign-appearing, observe.  Discussed cosmetic procedure to remove (cryotherapy), noncovered.  $60 for 1st lesion and $15 for each additional lesion if done on the same day.  Maximum charge $350.  One touch-up treatment included no charge. Discussed risks of treatment including dyspigmentation, small scar, and/or recurrence.  Pt will reschedule to have treated after Christmas    Lentigines - Scattered tan macules - Discussed due to sun exposure - Benign, observe - Call for any changes  Seborrheic Keratoses - Stuck-on, waxy, tan-brown papules and plaques  - Discussed benign etiology and prognosis. - Observe - Call for any changes  Melanocytic Nevi - Tan-brown and/or pink-flesh-colored symmetric macules and papules - Benign appearing on exam today - Observation - Call clinic for new or changing moles - Recommend daily use of  broad spectrum spf 30+ sunscreen to sun-exposed areas.   Hemangiomas - Red papules - Discussed benign nature - Observe - Call for any changes  Actinic Damage - Chronic, secondary to cumulative UV/sun exposure - diffuse scaly erythematous macules with underlying dyspigmentation - Recommend daily broad spectrum sunscreen SPF 30+ to sun-exposed areas, reapply every 2 hours as needed.  - Call for new or changing lesions.  Skin cancer screening performed today.   Return in about 1 year (around 01/21/2021) for TBSE, sooner for excision for cyst .   I, Ruthell Rummage, CMA, am acting as scribe for Brendolyn Patty, MD.  Documentation: I have reviewed the above documentation for accuracy and completeness, and I agree with the above.  Brendolyn Patty MD

## 2020-02-08 ENCOUNTER — Ambulatory Visit: Payer: Medicare Other | Admitting: Primary Care

## 2020-02-08 NOTE — Progress Notes (Unsigned)
Virtual Visit via Telephone Note  I connected with Patricia Curtis on 02/08/20 at  9:00 AM EST by telephone and verified that I am speaking with the correct person using two identifiers.  Location: Patient: Home Provider: Office   I discussed the limitations, risks, security and privacy concerns of performing an evaluation and management service by telephone and the availability of in person appointments. I also discussed with the patient that there may be a patient responsible charge related to this service. The patient expressed understanding and agreed to proceed.   History of Present Illness:    Observations/Objective:   Assessment and Plan:   Follow Up Instructions:    I discussed the assessment and treatment plan with the patient. The patient was provided an opportunity to ask questions and all were answered. The patient agreed with the plan and demonstrated an understanding of the instructions.   The patient was advised to call back or seek an in-person evaluation if the symptoms worsen or if the condition fails to improve as anticipated.  I provided *** minutes of non-face-to-face time during this encounter.   Martyn Ehrich, NP

## 2020-02-13 ENCOUNTER — Ambulatory Visit: Payer: Medicare Other | Admitting: Primary Care

## 2020-02-13 NOTE — Progress Notes (Signed)
Virtual Visit via Telephone Note  I connected with [name] on 02/13/20 at  9:30 AM EST by telephone and verified that I am speaking with the correct person using two identifiers.  Location: Patient: Home  Provider: Office    I discussed the limitations, risks, security and privacy concerns of performing an evaluation and management service by telephone and the availability of in person appointments. I also discussed with the patient that there may be a patient responsible charge related to this service. The patient expressed understanding and agreed to proceed.  History of Present Illness: 75 year old female, never smoked.  She significant for hypertension, type 2 diabetes, chronic kidney disease stage III, osteopenia, hyperlipidemia, abdominal hysterectomy, appendectomy, overweight.  Patient referred for sleep consult by Dr. Terese Door.   Previous [facility] pulmonary encounter: 12/24/2019 Patient presents today for sleep consult. Reports symptoms of snoring, restless sleep and daytime fatigue for the last 3-4 years. She still works part time 27 hours a week from home. It takes her at least an hour to fall asleep every night. She falls asleep with television on. She takes Melatonin 6mg  every night. She has tried tylenol PM but experienced residual grogginess the next day. She does not drink alcohol. She gets 5 hours of sleep a night. Her brother has sleep apnea.  Sleep questionnaire Prior sleep study- None Symptoms- Snoring, restless sleep, daytime fatigue Bedtime - 12am  Time to fall asleep- 1-2 hours  Wake time- 5-7am  Nocturnal awakening- 3-4 times Oxygen- None  [place]- 6  02/14/2020- Interim hx Patient contacted today for telephone visit to review sleep study results.  Home sleep study completed on 01/15/2020 showed mild obstructive sleep apnea, AHI 9.6 with SPO2 low 89%. Patient states that she sleeps well at night, only complaint is snoring. She has put on roughly 20 lbs over the  last 2 years. We reviewed treatment options include weight loss, side sleeping position, oral appliance, CPAP or referral to ENT for possible surgical intervention. She would like to work on weight loss and may consider oral appliance.   Observations/Objective:  - Able to speak in full sentences without over shortness of breath, wheezing or cough   Assessment and Plan:  Mild OSA: - HST 01/15/20 showed mild OSA without significant oxygen desaturation  - Reviewed treatment options and risk of untreated sleep apnea - She would like to work on weight loss and may consider oral appliance - Encourage side sleeping position as much as possible  - Patient is aware not to drive if experiencing excessive daytime fatigue   Follow Up Instructions:  - 6 months with either Dr. Halford Chessman or APP in [place]    I discussed the assessment and treatment plan with the patient. The patient was provided an opportunity to ask questions and all were answered. The patient agreed with the plan and demonstrated an understanding of the instructions.   The patient was advised to call back or seek an in-person evaluation if the symptoms worsen or if the condition fails to improve as anticipated.  I provided 18 minutes of non-face-to-face time during this encounter.   Martyn Ehrich, NP

## 2020-02-14 ENCOUNTER — Other Ambulatory Visit: Payer: Self-pay

## 2020-02-14 ENCOUNTER — Encounter: Payer: Self-pay | Admitting: Primary Care

## 2020-02-14 ENCOUNTER — Ambulatory Visit (INDEPENDENT_AMBULATORY_CARE_PROVIDER_SITE_OTHER): Payer: Medicare HMO | Admitting: Primary Care

## 2020-02-14 DIAGNOSIS — G4733 Obstructive sleep apnea (adult) (pediatric): Secondary | ICD-10-CM | POA: Diagnosis not present

## 2020-02-14 NOTE — Progress Notes (Signed)
Reviewed and agree with assessment/plan.   Chesley Mires, MD [facility] Pulmonary/Critical Care 02/14/2020, 9:53 AM Pager:  [phone]

## 2020-02-14 NOTE — Patient Instructions (Addendum)
Home sleep study showed mild obstructive sleep apnea without significant oxygen desaturation   Treatment options include weight loss, side sleeping position, oral appliance, CPAP or referral to ENT for possible surgical intervention  Recommend working on weight loss, goal 15-20 lbs   Consider oral appliance- you can schedule consult with either Dr. Augustina Mood [phone]) or Oneal Grout [phone])  Follow-up 6 months with [facility] NP in [facility]    Sleep Apnea Sleep apnea affects breathing during sleep. It causes breathing to stop for a short time or to become shallow. It can also increase the risk of:  Heart attack.  Stroke.  Being very overweight (obese).  Diabetes.  Heart failure.  Irregular heartbeat. The goal of treatment is to help you breathe normally again. What are the causes? There are three kinds of sleep apnea:  Obstructive sleep apnea. This is caused by a blocked or collapsed airway.  Central sleep apnea. This happens when the brain does not send the right signals to the muscles that control breathing.  Mixed sleep apnea. This is a combination of obstructive and central sleep apnea. The most common cause of this condition is a collapsed or blocked airway. This can happen if:  Your throat muscles are too relaxed.  Your tongue and tonsils are too large.  You are overweight.  Your airway is too small.   What increases the risk?  Being overweight.  Smoking.  Having a small airway.  Being older.  Being female.  Drinking alcohol.  Taking medicines to calm yourself (sedatives or tranquilizers).  Having family members with the condition. What are the signs or symptoms?  Trouble staying asleep.  Being sleepy or tired during the day.  Getting angry a lot.  Loud snoring.  Headaches in the morning.  Not being able to focus your mind (concentrate).  Forgetting things.  Less interest in sex.  Mood swings.  Personality  changes.  Feelings of sadness (depression).  Waking up a lot during the night to pee (urinate).  Dry mouth.  Sore throat. How is this diagnosed?  Your medical history.  A physical exam.  A test that is done when you are sleeping (sleep study). The test is most often done in a sleep lab but may also be done at home. How is this treated?  Sleeping on your side.  Using a medicine to get rid of mucus in your nose (decongestant).  Avoiding the use of alcohol, medicines to help you relax, or certain pain medicines (narcotics).  Losing weight, if needed.  Changing your diet.  Not smoking.  Using a machine to open your airway while you sleep, such as: ? An oral appliance. This is a mouthpiece that shifts your lower jaw forward. ? A CPAP device. This device blows air through a mask when you breathe out (exhale). ? An EPAP device. This has valves that you put in each nostril. ? A BPAP device. This device blows air through a mask when you breathe in (inhale) and breathe out.  Having surgery if other treatments do not work. It is important to get treatment for sleep apnea. Without treatment, it can lead to:  High blood pressure.  Coronary artery disease.  In men, not being able to have an erection (impotence).  Reduced thinking ability.   Follow these instructions at home: Lifestyle  Make changes that your doctor recommends.  Eat a healthy diet.  Lose weight if needed.  Avoid alcohol, medicines to help you relax, and some pain medicines.  Do not use any products that contain nicotine or tobacco, such as cigarettes, e-cigarettes, and chewing tobacco. If you need help quitting, ask your doctor. General instructions  Take over-the-counter and prescription medicines only as told by your doctor.  If you were given a machine to use while you sleep, use it only as told by your doctor.  If you are having surgery, make sure to tell your doctor you have sleep apnea. You may  need to bring your device with you.  Keep all follow-up visits as told by your doctor. This is important. Contact a doctor if:  The machine that you were given to use during sleep bothers you or does not seem to be working.  You do not get better.  You get worse. Get help right away if:  Your chest hurts.  You have trouble breathing in enough air.  You have an uncomfortable feeling in your back, arms, or stomach.  You have trouble talking.  One side of your body feels weak.  A part of your face is hanging down. These symptoms may be an emergency. Do not wait to see if the symptoms will go away. Get medical help right away. Call your local emergency services (911 in the [place] Do not drive yourself to the hospital. Summary  This condition affects breathing during sleep.  The most common cause is a collapsed or blocked airway.  The goal of treatment is to help you breathe normally while you sleep. This information is not intended to replace advice given to you by your health care provider. Make sure you discuss any questions you have with your health care provider. Document Revised: 11/04/2017 Document Reviewed: 09/13/2017 Elsevier Patient Education  [place].

## 2020-03-08 ENCOUNTER — Encounter: Payer: Self-pay | Admitting: Internal Medicine

## 2020-03-10 ENCOUNTER — Other Ambulatory Visit: Payer: Self-pay | Admitting: Internal Medicine

## 2020-03-10 DIAGNOSIS — N952 Postmenopausal atrophic vaginitis: Secondary | ICD-10-CM

## 2020-03-10 MED ORDER — ESTRADIOL 0.1 MG/GM VA CREA: 1.0000 | TOPICAL_CREAM | Freq: Every day | VAGINAL | 11 refills | Status: AC

## 2020-03-11 ENCOUNTER — Other Ambulatory Visit: Payer: Self-pay

## 2020-03-11 DIAGNOSIS — E785 Hyperlipidemia, unspecified: Secondary | ICD-10-CM

## 2020-03-11 MED ORDER — ROSUVASTATIN CALCIUM 40 MG PO TABS: 40.0000 mg | ORAL_TABLET | Freq: Every day | ORAL | 0 refills | Status: DC

## 2020-03-19 ENCOUNTER — Encounter: Payer: Self-pay | Admitting: Internal Medicine

## 2020-03-21 ENCOUNTER — Other Ambulatory Visit: Payer: Self-pay

## 2020-03-21 ENCOUNTER — Telehealth (INDEPENDENT_AMBULATORY_CARE_PROVIDER_SITE_OTHER): Payer: Medicare HMO | Admitting: Internal Medicine

## 2020-03-21 ENCOUNTER — Encounter: Payer: Self-pay | Admitting: Internal Medicine

## 2020-03-21 VITALS — Ht 64.0 in | Wt 158.0 lb

## 2020-03-21 DIAGNOSIS — G8929 Other chronic pain: Secondary | ICD-10-CM

## 2020-03-21 DIAGNOSIS — I10 Essential (primary) hypertension: Secondary | ICD-10-CM | POA: Diagnosis not present

## 2020-03-21 DIAGNOSIS — E785 Hyperlipidemia, unspecified: Secondary | ICD-10-CM | POA: Diagnosis not present

## 2020-03-21 DIAGNOSIS — E1159 Type 2 diabetes mellitus with other circulatory complications: Secondary | ICD-10-CM

## 2020-03-21 DIAGNOSIS — M545 Low back pain, unspecified: Secondary | ICD-10-CM | POA: Diagnosis not present

## 2020-03-21 DIAGNOSIS — I152 Hypertension secondary to endocrine disorders: Secondary | ICD-10-CM

## 2020-03-21 DIAGNOSIS — B009 Herpesviral infection, unspecified: Secondary | ICD-10-CM

## 2020-03-21 DIAGNOSIS — G4733 Obstructive sleep apnea (adult) (pediatric): Secondary | ICD-10-CM

## 2020-03-21 DIAGNOSIS — M542 Cervicalgia: Secondary | ICD-10-CM

## 2020-03-21 DIAGNOSIS — Z1231 Encounter for screening mammogram for malignant neoplasm of breast: Secondary | ICD-10-CM

## 2020-03-21 DIAGNOSIS — E538 Deficiency of other specified B group vitamins: Secondary | ICD-10-CM

## 2020-03-21 DIAGNOSIS — Z23 Encounter for immunization: Secondary | ICD-10-CM

## 2020-03-21 MED ORDER — BACLOFEN 10 MG PO TABS: 10.0000 mg | ORAL_TABLET | Freq: Every evening | ORAL | 3 refills | Status: AC | PRN

## 2020-03-21 MED ORDER — LOSARTAN POTASSIUM 50 MG PO TABS: 50.0000 mg | ORAL_TABLET | Freq: Every day | ORAL | 3 refills | Status: DC

## 2020-03-21 MED ORDER — DULOXETINE HCL 30 MG PO CPEP: 30.0000 mg | ORAL_CAPSULE | Freq: Every day | ORAL | 3 refills | Status: AC

## 2020-03-21 MED ORDER — FUROSEMIDE 20 MG PO TABS: 10.0000 mg | ORAL_TABLET | Freq: Every day | ORAL | 11 refills | Status: AC | PRN

## 2020-03-21 MED ORDER — VALACYCLOVIR HCL 1 G PO TABS: 1000.0000 mg | ORAL_TABLET | Freq: Two times a day (BID) | ORAL | 12 refills | Status: AC

## 2020-03-21 MED ORDER — PNEUMOCOCCAL 13-VAL CONJ VACC IM SUSP: 0.5000 mL | Freq: Once | INTRAMUSCULAR | 0 refills | Status: AC

## 2020-03-21 MED ORDER — TRAMADOL HCL 50 MG PO TABS: 50.0000 mg | ORAL_TABLET | Freq: Two times a day (BID) | ORAL | 2 refills | Status: AC | PRN

## 2020-03-21 MED ORDER — ROSUVASTATIN CALCIUM 40 MG PO TABS: 40.0000 mg | ORAL_TABLET | Freq: Every day | ORAL | 3 refills | Status: AC

## 2020-03-21 NOTE — Progress Notes (Signed)
Telephone Note  I connected with Patricia Curtis  on 03/21/20 at  9:20 AM EST by telephone and verified that I am speaking with the correct person using two identifiers.  Location patient: home, [place] Location provider:work or home office Persons participating in the virtual visit: patient, provider  I discussed the limitations of evaluation and management by telemedicine and the availability of in person appointments. The patient expressed understanding and agreed to proceed.   HPI: 1. dm2 with htn BP on losartan 50 qd lasix 10 mg qd prn on crestor 40 mg qhs  Doing well needs refills of all meds   -COVID-19 vaccine status: 3/3  ROS: See pertinent positives and negatives per HPI.  Past Medical History:  Diagnosis Date  . Arthritis   . Family history of heart attack   . Herpes   . Hyperlipidemia   . Hypertension   . Kidney disease    not specified; [facility] 04/20/16   . Prediabetes   . Syncope    2/2 neck and back pain in the past  . Vitamin D deficiency     Past Surgical History:  Procedure Laterality Date  . ABDOMINAL HYSTERECTOMY     age 43 y.o ? reason per pt DUB no h/o abnormal pap ovaries intact   . APPENDECTOMY     1963  . BREAST CYST ASPIRATION Right 1994  . BREAST SURGERY     bx right breast 2001 benign   . cataract surgery     08/27/19 [facility] [place]; right Dr. Adella Nissen; b/l      Current Outpatient Medications:  .  Ascorbic Acid (VITAMIN C) 1000 MG tablet, Take 1,000 mg by mouth daily. , Disp: , Rfl:  .  aspirin EC 81 MG tablet, Take by mouth daily. , Disp: , Rfl:  .  calcium carbonate (OSCAL) 1500 (600 Ca) MG TABS tablet, Take 600 mg of elemental calcium by mouth 2 (two) times daily with a meal., Disp: , Rfl:  .  Cholecalciferol (VITAMIN D-3) 125 MCG (5000 UT) TABS, Take by mouth. , Disp: , Rfl:  .  estradiol (ESTRACE) 0.1 MG/GM vaginal cream, Place 1 Applicatorful vaginally at bedtime. 1-3x per week as needed, Disp: 42.5 g, Rfl: 11 .  Melatonin 3 MG TABS,  Take by mouth daily. , Disp: , Rfl:  .  pneumococcal 13-valent conjugate vaccine (PREVNAR 13) SUSP injection, Inject 0.5 mLs into the muscle once for 1 dose., Disp: 0.5 mL, Rfl: 0 .  baclofen (LIORESAL) 10 MG tablet, Take 1 tablet (10 mg total) by mouth at bedtime as needed for muscle spasms., Disp: 90 each, Rfl: 3 .  DULoxetine (CYMBALTA) 30 MG capsule, Take 1 capsule (30 mg total) by mouth daily., Disp: 90 capsule, Rfl: 3 .  furosemide (LASIX) 20 MG tablet, Take 0.5 tablets (10 mg total) by mouth daily as needed. Prn, Disp: 45 tablet, Rfl: 11 .  losartan (COZAAR) 50 MG tablet, Take 1 tablet (50 mg total) by mouth daily., Disp: 90 tablet, Rfl: 3 .  rosuvastatin (CRESTOR) 40 MG tablet, Take 1 tablet (40 mg total) by mouth daily., Disp: 90 tablet, Rfl: 3 .  traMADol (ULTRAM) 50 MG tablet, Take 1 tablet (50 mg total) by mouth every 12 (twelve) hours as needed., Disp: 60 tablet, Rfl: 2 .  valACYclovir (VALTREX) 1000 MG tablet, Take 1 tablet (1,000 mg total) by mouth 2 (two) times daily. X 7-10 days outbreak, Disp: 30 tablet, Rfl: 12  EXAM:  VITALS per patient if applicable:  GENERAL: alert, oriented, appears well and in no acute distress  PSYCH/NEURO: pleasant and cooperative, no obvious depression or anxiety, speech and thought processing grossly intact  ASSESSMENT AND PLAN:  Discussed the following assessment and plan:  Hypertension associated with diabetes ([place]) - Plan: Comprehensive metabolic panel, Lipid panel, CBC with Differential/Platelet, Hemoglobin A1c, Microalbumin / creatinine urine ratio On losartan 50 mg qd, lasix 10 mg qd prn  Fasting labs 05/29/20  Herpes simplex - Plan: valACYclovir (VALTREX) 1000 MG tablet  Chronic low back pain, unspecified back pain laterality, unspecified whether sciatica present - Plan: traMADol (ULTRAM) 50 MG tablet, baclofen (LIORESAL) 10 MG tablet  Chronic neck pain - Plan: traMADol (ULTRAM) 50 MG tablet   Hyperlipidemia, unspecified  hyperlipidemia type - Plan: rosuvastatin (CRESTOR) 40 MG tablet  Other chronic pain - Plan: baclofen (LIORESAL) 10 MG tablet, DULoxetine (CYMBALTA) 30 MG capsule  Cervicalgia - Plan: baclofen (LIORESAL) 10 MG tablet  OSA (obstructive sleep apnea) Mild osa rec weight loss  rec mouth piece no cpap   HM Flu shot utd tdap utd Pfizer 3/3 shingrix had 01/07/17 and 06/23/17 No proof of prevnar or pna 23discuss in future -will get at pharmacy  MMR, hep B immune  Hep C neg  Mammogramneg11/12/21 ordered    colonoscopy 06/27/15 [place] [facility] small polyp sessile tubular f/u in 3 yearsto 5 years -due end 06/2020 will refer to Dr. Havery Moros [facility]   Never smoker   S/p hysterectomy DUB ovaries intact no h/o abnormal pap  DEXA sch 04/11/2018+osteopenia T score -2.1 rec calcium and vitamin Don 40K IU qd F/u 3-5 years  [employer] [place] eye exam no DM retinopathy f/u in 1 year 06/01/2018  referred [facility] pulm and dermatology  -we discussed possible serious and likely etiologies, options for evaluation and workup, limitations of telemedicine visit vs in person visit, treatment, treatment risks and precautions.    I discussed the assessment and treatment plan with the patient. The patient was provided an opportunity to ask questions and all were answered. The patient agreed with the plan and demonstrated an understanding of the instructions.    Time spent 20 min Delorise Jackson, MD  Provider: Dr. Olivia Mackie Provider: [facility]

## 2020-03-24 ENCOUNTER — Encounter: Payer: Medicare Other | Admitting: Dermatology

## 2020-03-31 ENCOUNTER — Ambulatory Visit: Payer: Medicare HMO | Admitting: Dermatology

## 2020-03-31 ENCOUNTER — Other Ambulatory Visit: Payer: Self-pay

## 2020-03-31 DIAGNOSIS — L720 Epidermal cyst: Secondary | ICD-10-CM | POA: Diagnosis not present

## 2020-03-31 DIAGNOSIS — D485 Neoplasm of uncertain behavior of skin: Secondary | ICD-10-CM

## 2020-03-31 NOTE — Patient Instructions (Signed)

## 2020-03-31 NOTE — Progress Notes (Signed)
   Follow-Up Visit   Subjective  Patricia Curtis is a 75 y.o. female who presents for the following: Procedure (Patient here today for cyst excision of the left spinal upper back.).  It is symptomatic.   The following portions of the chart were reviewed this encounter and updated as appropriate:       Review of Systems:  No other skin or systemic complaints except as noted in HPI or Assessment and Plan.  Objective  Well appearing patient in no apparent distress; mood and affect are within normal limits.  A focused examination was performed including post neck, upper back. Relevant physical exam findings are noted in the Assessment and Plan.  Objective  Left spinal upper back: Firm sq nodule 0.8cm   Assessment & Plan  Neoplasm of uncertain behavior of skin Left spinal upper back  Skin excision  Lesion length (cm):  0.8 Lesion width (cm):  0.8 Margin per side (cm):  0.2 Total excision diameter (cm):  1.2 Informed consent: discussed and consent obtained   Timeout: patient name, date of birth, surgical site, and procedure verified   Procedure prep:  Patient was prepped and draped in usual sterile fashion Prep type:  Povidone-iodine Anesthesia: the lesion was anesthetized in a standard fashion   Anesthetic:  1% lidocaine w/ epinephrine 1-100,000 buffered w/ 8.4% NaHCO3 (6cc) Instrument used: #15 blade   Hemostasis achieved with: pressure   Hemostasis achieved with comment:  Elcectrocautery Outcome: patient tolerated procedure well with no complications    Skin repair Complexity:  Intermediate Final length (cm):  1.9 Informed consent: discussed and consent obtained   Reason for type of repair: reduce tension to allow closure, reduce the risk of dehiscence, infection, and necrosis and reduce subcutaneous dead space and avoid a hematoma   Undermining: edges undermined   Subcutaneous layers (deep stitches):  Suture size:  4-0 Suture type: Vicryl (polyglactin 910)   Stitches:   Buried vertical mattress Fine/surface layer approximation (top stitches):  Suture size:  4-0 Suture type: nylon   Stitches: simple interrupted   Suture removal (days):  7 Hemostasis achieved with: suture Outcome: patient tolerated procedure well with no complications   Post-procedure details: sterile dressing applied and wound care instructions given   Dressing type: pressure dressing (mupirocin)    Specimen 1 - Surgical pathology Differential Diagnosis: Cyst vs other Check Margins: No Firm sq nodule 0.8cm  Return in about 1 week (around 04/07/2020) for suture removal and cosmetic LN2 (SK on back).  Documentation: I have reviewed the above documentation for accuracy and completeness, and I agree with the above.  Brendolyn Patty MD

## 2020-04-01 ENCOUNTER — Telehealth: Payer: Self-pay

## 2020-04-01 NOTE — Telephone Encounter (Signed)
Left message for patient to call office with any problems from surgery yesterday.

## 2020-04-07 ENCOUNTER — Other Ambulatory Visit: Payer: Self-pay

## 2020-04-07 ENCOUNTER — Ambulatory Visit (INDEPENDENT_AMBULATORY_CARE_PROVIDER_SITE_OTHER): Payer: Medicare HMO | Admitting: Dermatology

## 2020-04-07 DIAGNOSIS — L820 Inflamed seborrheic keratosis: Secondary | ICD-10-CM

## 2020-04-07 DIAGNOSIS — L821 Other seborrheic keratosis: Secondary | ICD-10-CM

## 2020-04-07 DIAGNOSIS — L720 Epidermal cyst: Secondary | ICD-10-CM

## 2020-04-07 NOTE — Patient Instructions (Signed)
Cryotherapy Aftercare  . Wash gently with soap and water everyday.   . Apply Vaseline and Band-Aid daily until healed.  

## 2020-04-07 NOTE — Progress Notes (Signed)
   Follow-Up Visit   Subjective  Patricia Curtis is a 75 y.o. female who presents for the following: Cyst (Biopsy proven. Patient here for suture removal today.).  She also has some residual ISKs that haven't cleared completely with LN2.  She also has a cosmetic SK that she wants treated on her shoulder.   The following portions of the chart were reviewed this encounter and updated as appropriate:       Review of Systems:  No other skin or systemic complaints except as noted in HPI or Assessment and Plan.  Objective  Well appearing patient in no apparent distress; mood and affect are within normal limits.  A focused examination was performed including face, legs, back. Relevant physical exam findings are noted in the Assessment and Plan.  Objective  L spinal upper back: Excision site healing well, no evidence of infection   Objective  Left Posterior Shoulder: Stuck-on, waxy, tan-brown patch--Discussed benign etiology and prognosis.   Images    Objective  Right Upper Calf x 5 (5): Residual erythematous keratotic or waxy stuck-on papule.    Assessment & Plan  Epidermal inclusion cyst L spinal upper back  Wound cleansed, sutures removed, wound cleansed and steri strips applied. Discussed pathology results.   Seborrheic keratosis Left Posterior Shoulder  Discussed cosmetic procedure (Cryotherapy), noncovered.  $60 for 1st lesion and $15 for each additional lesion if done on the same day.  Maximum charge $350.  One touch-up treatment included no charge. Discussed risks of treatment including dyspigmentation, small scar, and/or recurrence. Recommend daily broad spectrum sunscreen SPF 30+/photoprotection to treated areas once healed.  LN2 x 1 today.  Destruction of lesion - Left Posterior Shoulder  Destruction method: cryotherapy   Informed consent: discussed and consent obtained   Lesion destroyed using liquid nitrogen: Yes   Region frozen until ice ball extended beyond  lesion: Yes   Outcome: patient tolerated procedure well with no complications   Post-procedure details: wound care instructions given   Additional details:  Prior to procedure, discussed risks of blister formation, small wound, skin dyspigmentation, or rare scar following cryotherapy.  Inflamed seborrheic keratosis (5) Right Upper Calf x 5  Destruction of lesion - Right Upper Calf x 5  Destruction method: cryotherapy   Informed consent: discussed and consent obtained   Lesion destroyed using liquid nitrogen: Yes   Region frozen until ice ball extended beyond lesion: Yes   Outcome: patient tolerated procedure well with no complications   Post-procedure details: wound care instructions given    Return in about 2 months (around 06/07/2020) for f/u cosmetic SK. .   Documentation: I have reviewed the above documentation for accuracy and completeness, and I agree with the above.  Brendolyn Patty MD

## 2020-04-18 ENCOUNTER — Other Ambulatory Visit: Payer: Self-pay | Admitting: Internal Medicine

## 2020-04-18 ENCOUNTER — Encounter: Payer: Self-pay | Admitting: Internal Medicine

## 2020-04-18 DIAGNOSIS — I10 Essential (primary) hypertension: Secondary | ICD-10-CM

## 2020-04-18 MED ORDER — LOSARTAN POTASSIUM 50 MG PO TABS: 50.0000 mg | ORAL_TABLET | Freq: Every day | ORAL | 3 refills | Status: DC

## 2020-04-23 ENCOUNTER — Other Ambulatory Visit: Payer: Self-pay | Admitting: Internal Medicine

## 2020-04-23 DIAGNOSIS — I10 Essential (primary) hypertension: Secondary | ICD-10-CM

## 2020-04-23 MED ORDER — LOSARTAN POTASSIUM 50 MG PO TABS: 50.0000 mg | ORAL_TABLET | Freq: Every day | ORAL | 3 refills | Status: AC

## 2020-04-23 MED ORDER — OLMESARTAN MEDOXOMIL 20 MG PO TABS
20.0000 mg | ORAL_TABLET | Freq: Every day | ORAL | 3 refills | Status: AC
Start: 2020-04-23 — End: ?

## 2020-05-30 ENCOUNTER — Other Ambulatory Visit: Payer: Medicare HMO

## 2020-06-03 ENCOUNTER — Encounter: Payer: Self-pay | Admitting: Dermatology

## 2020-06-24 ENCOUNTER — Ambulatory Visit: Payer: Medicare HMO | Admitting: Dermatology

## 2020-10-23 ENCOUNTER — Ambulatory Visit: Payer: Medicare Other

## 2020-11-06 ENCOUNTER — Telehealth: Payer: Self-pay

## 2020-11-06 NOTE — Telephone Encounter (Signed)
Received a request for records from [facility] for [facility]. The form has been sent to scan and sent to Medical Records through interoffice mail.

## 2020-11-14 ENCOUNTER — Ambulatory Visit: Payer: Medicare HMO | Admitting: Internal Medicine

## 2021-02-10 ENCOUNTER — Encounter: Payer: Medicare Other | Admitting: Dermatology

## 2021-07-22 IMAGING — MG DIGITAL SCREENING BILAT W/ TOMO W/ CAD
8 series · 8 of 24 positions shown · non-contrast
Comparison: Previous exam(s).

CLINICAL DATA: Screening.

EXAM:
DIGITAL SCREENING BILATERAL MAMMOGRAM WITH TOMO AND CAD

[L MLO synth-2D]
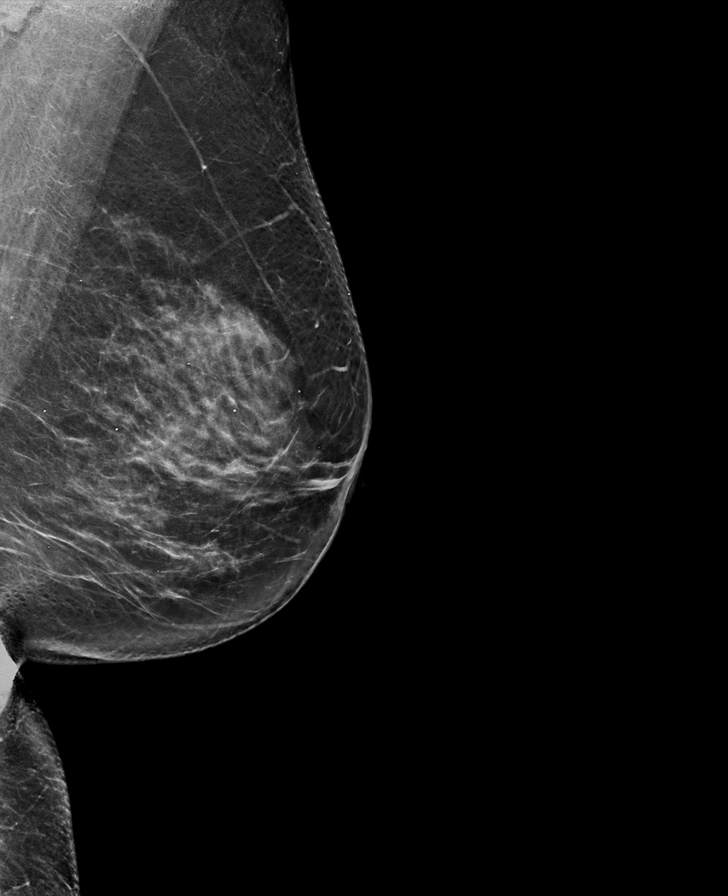

[L CC synth-2D]
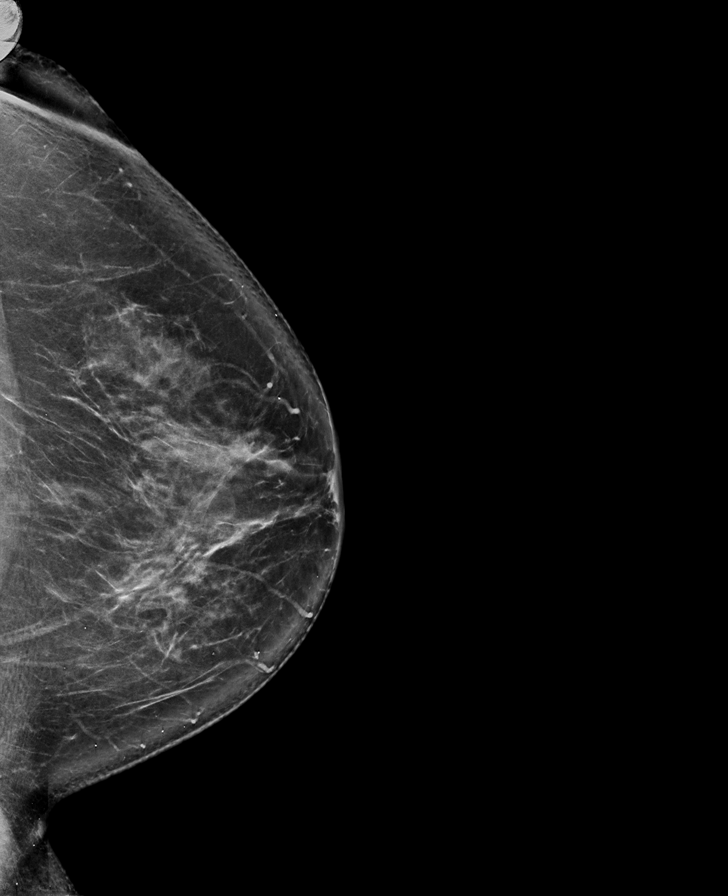

[R CC synth-2D]
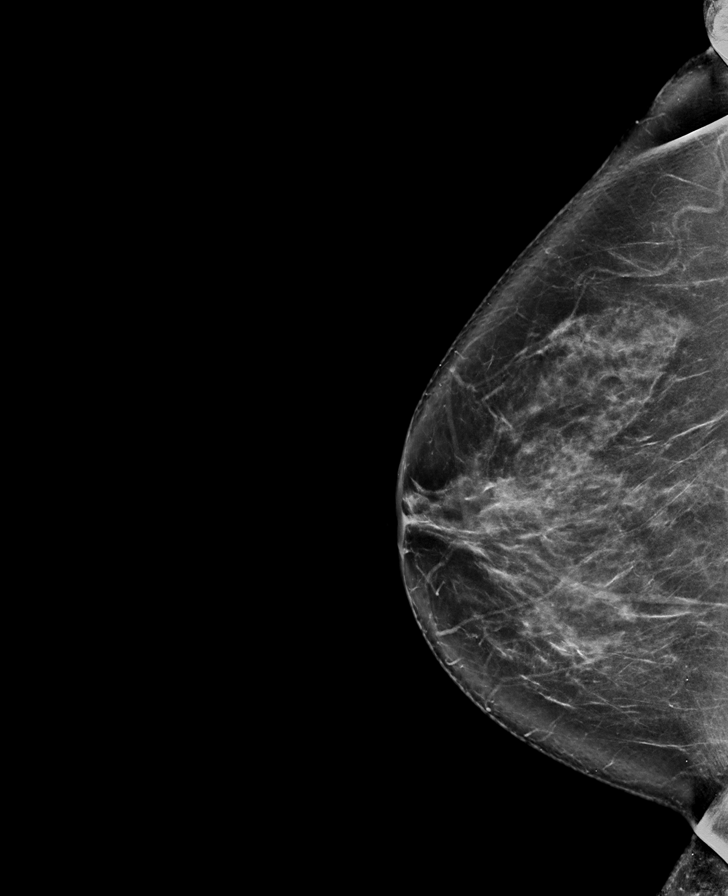

[R MLO synth-2D]
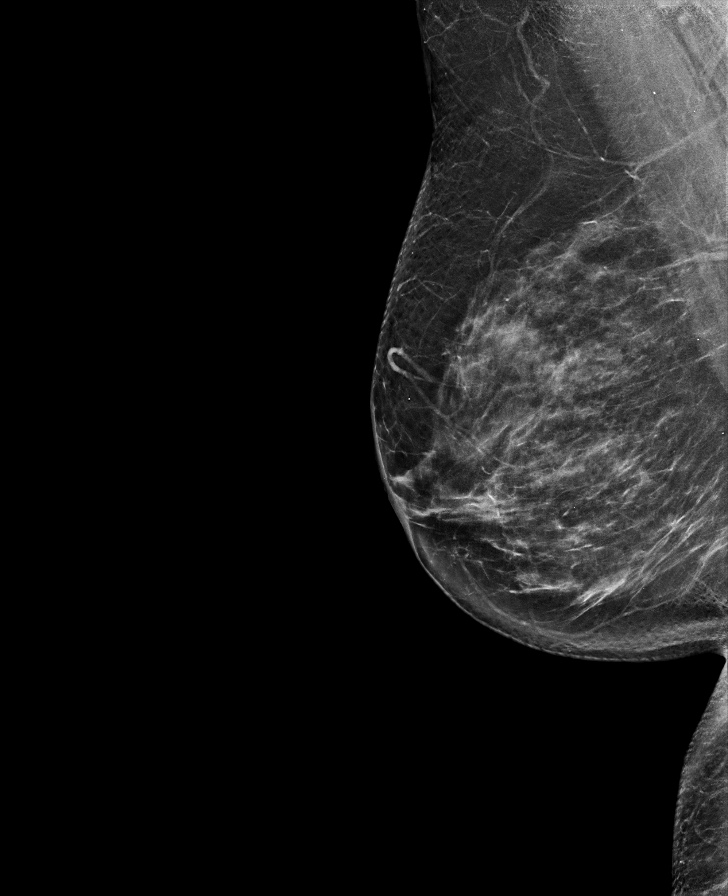

[L CC tomo · tomo slice 44/87.0]
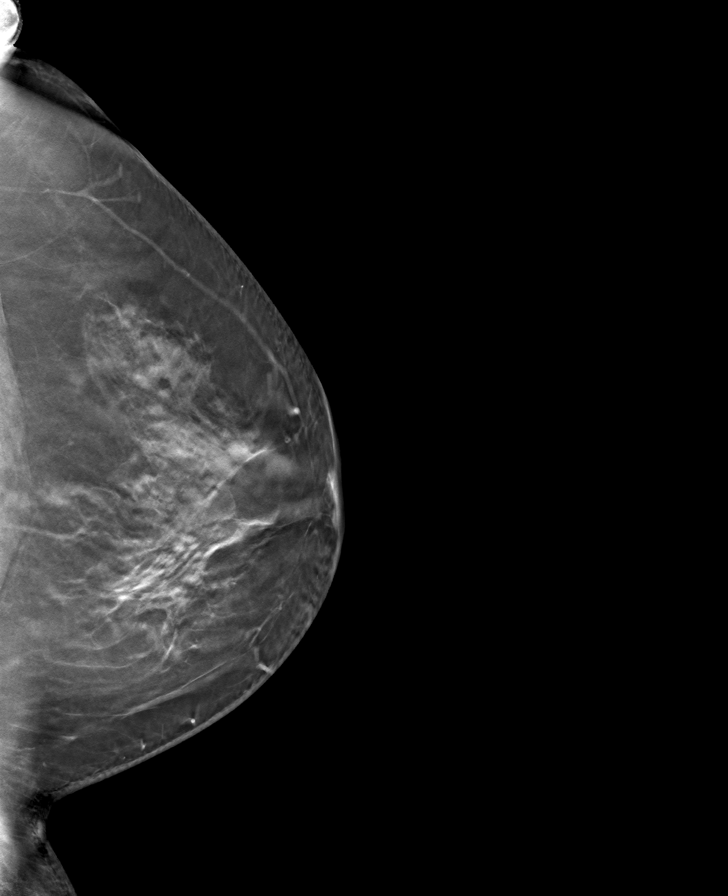

[L MLO tomo · tomo slice 43/85.0]
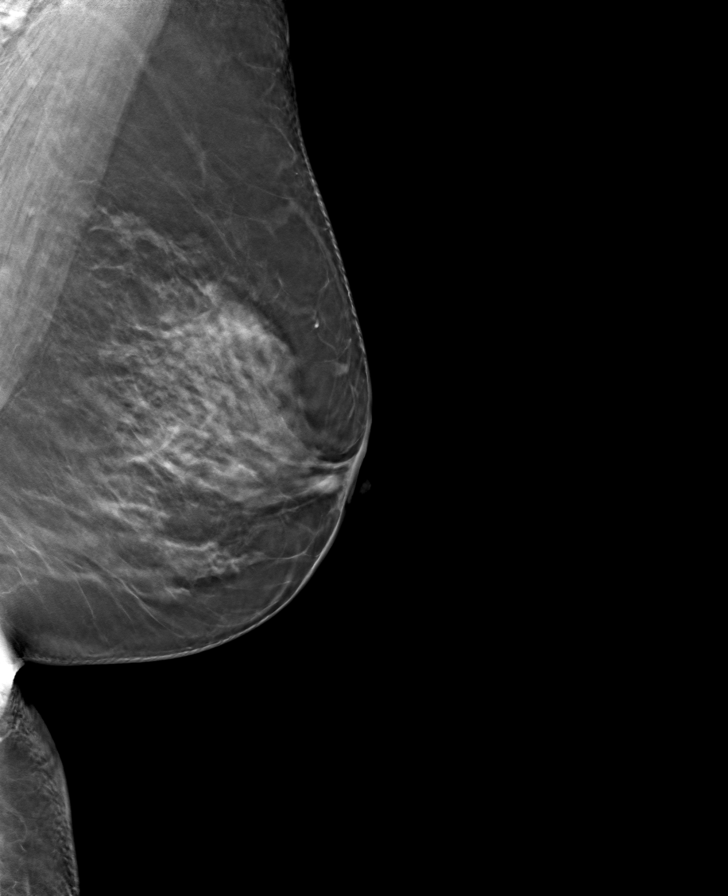

[R CC tomo · tomo slice 44/87.0]
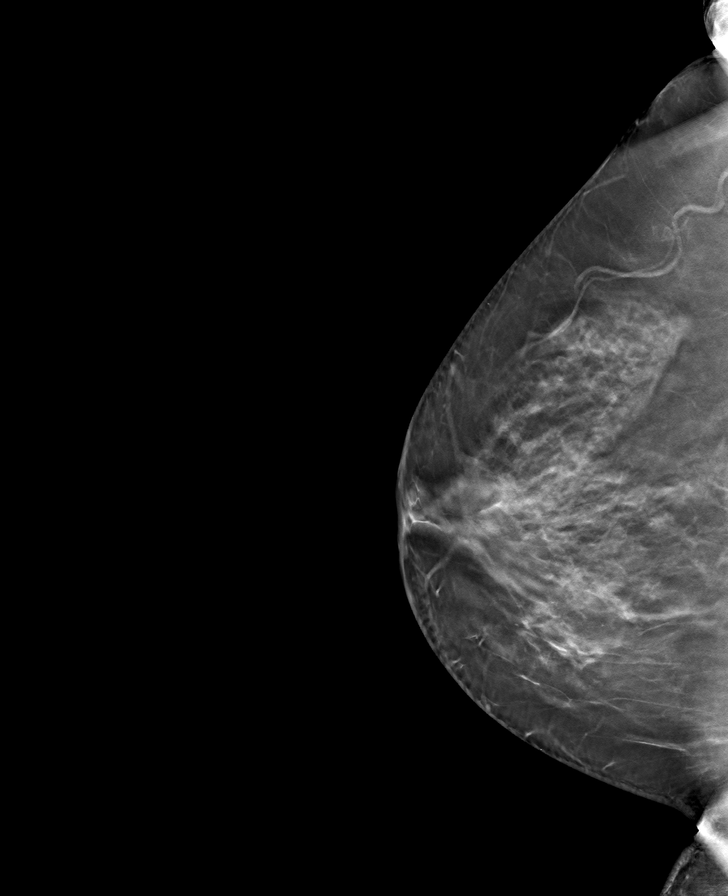

[R MLO tomo · tomo slice 43/84.0]
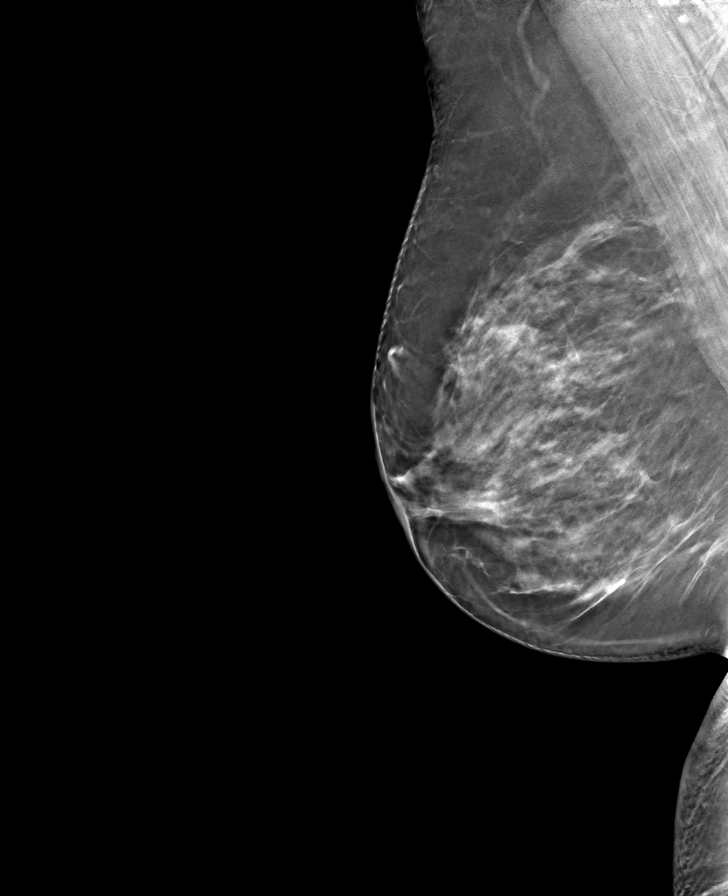

[8 of 24 positions shown; findings below may reference images not displayed]

ACR Breast Density Category c: The breast tissue is heterogeneously
dense, which may obscure small masses.
FINDINGS: There are no findings suspicious for malignancy. Images were
processed with CAD.
IMPRESSION: No mammographic evidence of malignancy. A result letter of this
screening mammogram will be mailed directly to the patient.

RECOMMENDATION:
Screening mammogram in one year. (Code:[id number])

BI-RADS CATEGORY  1: Negative.
